# Patient Record
Sex: Male | Born: 1970 | Race: Black or African American | Hispanic: No | Marital: Single | Smoking: Never smoker
Health system: Southern US, Community
[De-identification: ages and names within clinical notes are randomized; demographics above are authoritative.]

## PROBLEM LIST (undated history)

## (undated) DIAGNOSIS — I10 Essential (primary) hypertension: Secondary | ICD-10-CM

---

## 2005-01-05 ENCOUNTER — Emergency Department: Payer: Self-pay | Admitting: Emergency Medicine

## 2005-02-18 ENCOUNTER — Emergency Department: Payer: Self-pay | Admitting: Emergency Medicine

## 2005-02-23 ENCOUNTER — Ambulatory Visit: Payer: Self-pay

## 2005-05-06 ENCOUNTER — Emergency Department: Payer: Self-pay | Admitting: Emergency Medicine

## 2006-12-12 ENCOUNTER — Emergency Department: Payer: Self-pay | Admitting: Emergency Medicine

## 2006-12-12 ENCOUNTER — Other Ambulatory Visit: Payer: Self-pay

## 2010-06-04 ENCOUNTER — Emergency Department: Payer: Self-pay | Admitting: Emergency Medicine

## 2013-09-06 ENCOUNTER — Emergency Department: Payer: Self-pay | Admitting: Emergency Medicine

## 2015-08-02 ENCOUNTER — Emergency Department
Admission: EM | Admit: 2015-08-02 | Discharge: 2015-08-02 | Disposition: A | Discharge status: Home / Self Care | Payer: Commercial Managed Care - HMO | Attending: Emergency Medicine | Admitting: Emergency Medicine

## 2015-08-02 ENCOUNTER — Encounter: Payer: Self-pay | Admitting: Emergency Medicine

## 2015-08-02 ENCOUNTER — Emergency Department: Payer: Commercial Managed Care - HMO

## 2015-08-02 ENCOUNTER — Other Ambulatory Visit: Payer: Self-pay

## 2015-08-02 DIAGNOSIS — R202 Paresthesia of skin: Secondary | ICD-10-CM | POA: Insufficient documentation

## 2015-08-02 DIAGNOSIS — R519 Headache, unspecified: Secondary | ICD-10-CM

## 2015-08-02 DIAGNOSIS — R55 Syncope and collapse: Secondary | ICD-10-CM | POA: Diagnosis not present

## 2015-08-02 DIAGNOSIS — I10 Essential (primary) hypertension: Secondary | ICD-10-CM | POA: Diagnosis not present

## 2015-08-02 DIAGNOSIS — R51 Headache: Secondary | ICD-10-CM | POA: Diagnosis not present

## 2015-08-02 DIAGNOSIS — R531 Weakness: Secondary | ICD-10-CM | POA: Diagnosis present

## 2015-08-02 DIAGNOSIS — Z79899 Other long term (current) drug therapy: Secondary | ICD-10-CM | POA: Insufficient documentation

## 2015-08-02 HISTORY — DX: Essential (primary) hypertension: I10

## 2015-08-02 LAB — CBC WITH DIFFERENTIAL/PLATELET
BASOS ABS: 0 10*3/uL (ref 0–0.1)
Basophils Relative: 1 %
EOS PCT: 3 %
Eosinophils Absolute: 0.1 10*3/uL (ref 0–0.7)
HCT: 42.1 % (ref 40.0–52.0)
HEMOGLOBIN: 14.5 g/dL (ref 13.0–18.0)
Lymphocytes Relative: 37 %
Lymphs Abs: 1.7 10*3/uL (ref 1.0–3.6)
MCH: 33.1 pg (ref 26.0–34.0)
MCHC: 34.4 g/dL (ref 32.0–36.0)
MCV: 96.1 fL (ref 80.0–100.0)
MONOS PCT: 11 %
Monocytes Absolute: 0.5 10*3/uL (ref 0.2–1.0)
NEUTROS ABS: 2.2 10*3/uL (ref 1.4–6.5)
NEUTROS PCT: 48 %
Platelets: 223 10*3/uL (ref 150–440)
RBC: 4.38 MIL/uL — AB (ref 4.40–5.90)
RDW: 14.2 % (ref 11.5–14.5)
WBC: 4.7 10*3/uL (ref 3.8–10.6)

## 2015-08-02 LAB — TROPONIN I: Troponin I: <0.03 ng/mL (ref <0.031)

## 2015-08-02 LAB — BASIC METABOLIC PANEL
ANION GAP: 8 (ref 5–15)
BUN: 12 mg/dL (ref 6–20)
CALCIUM: 9.4 mg/dL (ref 8.9–10.3)
CHLORIDE: 100 mmol/L — AB (ref 101–111)
CO2: 32 mmol/L (ref 22–32)
CREATININE: 0.95 mg/dL (ref 0.61–1.24)
Glucose, Bld: 99 mg/dL (ref 65–99)
POTASSIUM: 3.2 mmol/L — AB (ref 3.5–5.1)
SODIUM: 140 mmol/L (ref 135–145)

## 2015-08-02 MED ORDER — PROCHLORPERAZINE EDISYLATE 5 MG/ML IJ SOLN
10.0000 mg | Freq: Once | INTRAMUSCULAR | Status: AC
  Administered 2015-08-02: 10 mg via INTRAVENOUS
  Filled 2015-08-02: qty 2

## 2015-08-02 MED ORDER — SODIUM CHLORIDE 0.9 % IV BOLUS (SEPSIS)
1000.0000 mL | Freq: Once | INTRAVENOUS | Status: AC
  Administered 2015-08-02: 1000 mL via INTRAVENOUS

## 2015-08-02 NOTE — ED Notes (Signed)
Pt arrived via EMS. Pt reports he started to not feel well this morning and was driving and started to feel like his whole body went numb and lightheaded.  Was also diaphoretic. Reports headache started this morning before leaving, but got worse as he was driving. States he was having trouble grasping the steering wheel and pulled over into a parking lot. Patient is alert and oriented.  No slurred speech.  HA pain is 5/10 currently, but while driving HA pain was 16/10.

## 2015-08-02 NOTE — ED Provider Notes (Addendum)
[facility] Emergency Department Provider Note  ____________________________________________  Time seen: Seen upon arrival to the emergency department.  I have reviewed the triage vital signs and the nursing notes.   HISTORY  Chief Complaint Weakness    HPI Ricky Compton is a 44 y.o. male with a history of hypertension presents today with headache and near syncope. He said he woke this morning at about 7 AM with a mild frontal headache. He said that he was not hungry and so did not eat. He was then driving in his car and said that he felt his headache increase to a 10 out of 10 and then had numbness and tingling and weakness to his bilateral upper and lower extremities. He became acutely diaphoretic. This lasted for about 15 minutes. He has a mild frontal headache at this time but no longer has any numbness or weakness. Has a family history of high blood pressure but without any stroke or MI.   Past Medical History  Diagnosis Date  . Hypertension     There are no active problems to display for this patient.   History reviewed. No pertinent past surgical history.  Current Outpatient Rx  Name  Route  Sig  Dispense  Refill  . lisinopril-hydrochlorothiazide (PRINZIDE,ZESTORETIC) 10-12.5 MG per tablet   Oral   Take 1 tablet by mouth daily.           Allergies Tramadol and Tramadol-acetaminophen  History reviewed. No pertinent family history.  Social History History  Substance Use Topics  . Smoking status: Never Smoker   . Smokeless tobacco: Never Used  . Alcohol Use: No    Review of Systems Constitutional: No fever/chills Eyes: No visual changes. ENT: No sore throat. Cardiovascular: Denies chest pain. Respiratory: Denies shortness of breath. Gastrointestinal: No abdominal pain.  No nausea, no vomiting.  No diarrhea.  No constipation. Genitourinary: Negative for dysuria. Musculoskeletal: Negative for back pain. Skin: Negative for  rash. Neurological: Negative for headaches, focal weakness or numbness.  10-point ROS otherwise negative.  ____________________________________________   PHYSICAL EXAM:  VITAL SIGNS: ED Triage Vitals  Enc Vitals Group     BP 08/02/15 1109 141/98 mmHg     Pulse Rate 08/02/15 1109 63     Resp 08/02/15 1109 18     Temp 08/02/15 1109 98.3 F (36.8 C)     Temp Source 08/02/15 1109 Oral     SpO2 08/02/15 1109 97 %     Weight --      Height --      Head Cir --      Peak Flow --      Pain Score 08/02/15 1110 5     Pain Loc --      Pain Edu? --      Excl. in GC? --     Constitutional: Alert and oriented. Well appearing and in no acute distress. Eyes: Conjunctivae are normal. PERRL. EOMI. Head: Atraumatic. Nose: No congestion/rhinnorhea. Mouth/Throat: Mucous membranes are moist.  Oropharynx non-erythematous. Neck: No stridor.   Cardiovascular: Normal rate, regular rhythm. Grossly normal heart sounds.  Good peripheral circulation. Respiratory: Normal respiratory effort.  No retractions. Lungs CTAB. Gastrointestinal: Soft and nontender. No distention. No abdominal bruits. No CVA tenderness. Musculoskeletal: No lower extremity tenderness nor edema.  No joint effusions. Neurologic:  Normal speech and language. No gross focal neurologic deficits are appreciated. No gait instability. Skin:  Skin is warm, dry and intact. No rash noted. Psychiatric: Mood and affect are normal.  Speech and behavior are normal.  ____________________________________________   LABS (all labs ordered are listed, but only abnormal results are displayed)  Labs Reviewed  CBC WITH DIFFERENTIAL/PLATELET - Abnormal; Notable for the following:    RBC 4.38 (*)    All other components within normal limits  BASIC METABOLIC PANEL - Abnormal; Notable for the following:    Potassium 3.2 (*)    Chloride 100 (*)    All other components within normal limits  TROPONIN I  TROPONIN I    ____________________________________________  EKG  ED ECG REPORT I, Arelia Longest, the attending physician, personally viewed and interpreted this ECG.   Date: 08/02/2015  EKG Time: 1107  Rate: 58  Rhythm: Sinus bradycardia  Axis: Normal axis  Intervals:none  ST&T Change: No ST elevations or depressions. No abnormal T-wave inversions.  ____________________________________________  RADIOLOGY  Chest x-ray without any acute cardiopulmonary process. I personally reviewed these films.  CT of the brain without acute process. ____________________________________________   PROCEDURES   ____________________________________________   INITIAL IMPRESSION / ASSESSMENT AND PLAN / ED COURSE  Pertinent labs & imaging results that were available during my care of the patient were reviewed by me and considered in my medical decision making (see chart for details). ----------------------------------------- 4:00 PM on 08/02/2015 -----------------------------------------  Patient feeling back to baseline. No chest pain or headache at this time. Possible vagal response from nausea and not eating. No positive findings on workup today. Discussed the case with Dr. Welton Flakes of cardiology who will see the patient on Monday 1:30 in the office. Because of the patient's hypertension though the patient has some risk for cardiac cause to this and should follow with cardiology. Otherwise I counseled the patient to make sure to stay hydrated and to try not to skip meals. There could be some relation to his nausea and not eating this morning because this episode. ____________________________________________   FINAL CLINICAL IMPRESSION(S) / ED DIAGNOSES  Acute near-syncope. Acute headache. Initial visit.    Myrna Blazer, MD 08/02/15 1601  Furthermore, is unlikely to be subarachnoid or aneurysmal episode. Head CT done within 6 hours and negative. Headache Resolved by time patient  arrived to the emergency department.  Myrna Blazer, MD 08/02/15 [phone]

## 2015-08-02 NOTE — ED Notes (Signed)
Patient transported to CT and XR 

## 2017-02-14 DIAGNOSIS — I10 Essential (primary) hypertension: Secondary | ICD-10-CM | POA: Diagnosis not present

## 2017-02-14 DIAGNOSIS — E7800 Pure hypercholesterolemia, unspecified: Secondary | ICD-10-CM | POA: Diagnosis not present

## 2017-02-14 DIAGNOSIS — M109 Gout, unspecified: Secondary | ICD-10-CM | POA: Diagnosis not present

## 2017-02-17 DIAGNOSIS — E790 Hyperuricemia without signs of inflammatory arthritis and tophaceous disease: Secondary | ICD-10-CM | POA: Diagnosis not present

## 2017-02-17 DIAGNOSIS — E7800 Pure hypercholesterolemia, unspecified: Secondary | ICD-10-CM | POA: Diagnosis not present

## 2017-02-17 DIAGNOSIS — I10 Essential (primary) hypertension: Secondary | ICD-10-CM | POA: Diagnosis not present

## 2017-02-17 DIAGNOSIS — Z0001 Encounter for general adult medical examination with abnormal findings: Secondary | ICD-10-CM | POA: Diagnosis not present

## 2017-03-02 DIAGNOSIS — D649 Anemia, unspecified: Secondary | ICD-10-CM | POA: Diagnosis not present

## 2017-04-26 DIAGNOSIS — M542 Cervicalgia: Secondary | ICD-10-CM | POA: Diagnosis not present

## 2017-08-11 DIAGNOSIS — D649 Anemia, unspecified: Secondary | ICD-10-CM | POA: Diagnosis not present

## 2017-08-11 DIAGNOSIS — E790 Hyperuricemia without signs of inflammatory arthritis and tophaceous disease: Secondary | ICD-10-CM | POA: Diagnosis not present

## 2017-08-11 DIAGNOSIS — I10 Essential (primary) hypertension: Secondary | ICD-10-CM | POA: Diagnosis not present

## 2017-08-18 DIAGNOSIS — I10 Essential (primary) hypertension: Secondary | ICD-10-CM | POA: Diagnosis not present

## 2017-08-18 DIAGNOSIS — E790 Hyperuricemia without signs of inflammatory arthritis and tophaceous disease: Secondary | ICD-10-CM | POA: Diagnosis not present

## 2017-08-18 DIAGNOSIS — E7800 Pure hypercholesterolemia, unspecified: Secondary | ICD-10-CM | POA: Diagnosis not present

## 2017-10-13 DIAGNOSIS — Z23 Encounter for immunization: Secondary | ICD-10-CM | POA: Diagnosis not present

## 2017-10-26 DIAGNOSIS — R1310 Dysphagia, unspecified: Secondary | ICD-10-CM | POA: Diagnosis not present

## 2017-12-01 HISTORY — PX: ESOPHAGOGASTRODUODENOSCOPY: SHX1529

## 2018-02-13 DIAGNOSIS — E7800 Pure hypercholesterolemia, unspecified: Secondary | ICD-10-CM | POA: Diagnosis not present

## 2018-02-13 DIAGNOSIS — I10 Essential (primary) hypertension: Secondary | ICD-10-CM | POA: Diagnosis not present

## 2018-02-13 DIAGNOSIS — E790 Hyperuricemia without signs of inflammatory arthritis and tophaceous disease: Secondary | ICD-10-CM | POA: Diagnosis not present

## 2018-02-22 DIAGNOSIS — Z0001 Encounter for general adult medical examination with abnormal findings: Secondary | ICD-10-CM | POA: Diagnosis not present

## 2018-02-22 DIAGNOSIS — E7800 Pure hypercholesterolemia, unspecified: Secondary | ICD-10-CM | POA: Diagnosis not present

## 2018-02-22 DIAGNOSIS — I10 Essential (primary) hypertension: Secondary | ICD-10-CM | POA: Diagnosis not present

## 2018-08-16 DIAGNOSIS — I10 Essential (primary) hypertension: Secondary | ICD-10-CM | POA: Diagnosis not present

## 2018-08-24 DIAGNOSIS — E7800 Pure hypercholesterolemia, unspecified: Secondary | ICD-10-CM | POA: Diagnosis not present

## 2018-08-24 DIAGNOSIS — I10 Essential (primary) hypertension: Secondary | ICD-10-CM | POA: Diagnosis not present

## 2018-10-01 DIAGNOSIS — R300 Dysuria: Secondary | ICD-10-CM | POA: Diagnosis not present

## 2018-10-01 DIAGNOSIS — N342 Other urethritis: Secondary | ICD-10-CM | POA: Diagnosis not present

## 2018-12-12 ENCOUNTER — Emergency Department: Payer: 59

## 2018-12-12 ENCOUNTER — Encounter: Payer: Self-pay | Admitting: Emergency Medicine

## 2018-12-12 ENCOUNTER — Other Ambulatory Visit: Payer: Self-pay

## 2018-12-12 ENCOUNTER — Emergency Department
Admission: EM | Admit: 2018-12-12 | Discharge: 2018-12-12 | Disposition: A | Discharge status: Home / Self Care | Payer: 59 | Attending: Emergency Medicine | Admitting: Emergency Medicine

## 2018-12-12 DIAGNOSIS — Z79899 Other long term (current) drug therapy: Secondary | ICD-10-CM | POA: Insufficient documentation

## 2018-12-12 DIAGNOSIS — I10 Essential (primary) hypertension: Secondary | ICD-10-CM | POA: Insufficient documentation

## 2018-12-12 DIAGNOSIS — R0789 Other chest pain: Secondary | ICD-10-CM | POA: Diagnosis not present

## 2018-12-12 DIAGNOSIS — R079 Chest pain, unspecified: Secondary | ICD-10-CM | POA: Diagnosis not present

## 2018-12-12 LAB — CBC
HEMATOCRIT: 43.5 % (ref 39.0–52.0)
HEMOGLOBIN: 14.4 g/dL (ref 13.0–17.0)
MCH: 31.7 pg (ref 26.0–34.0)
MCHC: 33.1 g/dL (ref 30.0–36.0)
MCV: 95.8 fL (ref 80.0–100.0)
NRBC: 0 % (ref 0.0–0.2)
Platelets: 239 10*3/uL (ref 150–400)
RBC: 4.54 MIL/uL (ref 4.22–5.81)
RDW: 13.2 % (ref 11.5–15.5)
WBC: 5.5 10*3/uL (ref 4.0–10.5)

## 2018-12-12 LAB — BASIC METABOLIC PANEL
Anion gap: 11 (ref 5–15)
BUN: 15 mg/dL (ref 6–20)
CO2: 29 mmol/L (ref 22–32)
Calcium: 9.9 mg/dL (ref 8.9–10.3)
Chloride: 99 mmol/L (ref 98–111)
Creatinine, Ser: 0.91 mg/dL (ref 0.61–1.24)
GFR calc Af Amer: >60 mL/min (ref >60)
Glucose, Bld: 101 mg/dL — ABNORMAL HIGH (ref 70–99)
POTASSIUM: 3 mmol/L — AB (ref 3.5–5.1)
SODIUM: 139 mmol/L (ref 135–145)

## 2018-12-12 LAB — TROPONIN I

## 2018-12-12 NOTE — ED Notes (Signed)
MD at bedside. 

## 2018-12-12 NOTE — ED Triage Notes (Signed)
Says startedc haing middle chest pain about 930am  Says "light pain"

## 2018-12-12 NOTE — ED Notes (Signed)
Pt states he was at work and had about 5 minutes of central CP. Denies any other symptoms. Hx HTN. Hasn't missed any doses. A&O, ambulatory. No distress noted. Pt denies pain at this time, only c/o is he is tired.

## 2018-12-12 NOTE — ED Provider Notes (Signed)
[facility] Emergency Department Provider Note   ____________________________________________    I have reviewed the triage vital signs and the nursing notes.   HISTORY  Chief Complaint Chest Pain     HPI Ricky Compton is a 47 y.o. male who presents with complaints of chest pain.  Patient describes burning pressure-like pain in his chest at approximately 930 this morning while at work.  His coworkers told him he looked "sweaty ".  He described mild nausea as well.  Symptoms were resolved upon arrival to the ED.  He does not smoke.  He reports his blood pressure is good and that he eats "right".  Does have a history of GERD.  No history of heart disease in his family.  He reports he worked third shift last night.  No radiation of pain.  Has not take anything for this   Past Medical History:  Diagnosis Date  . Hypertension     There are no active problems to display for this patient.   History reviewed. No pertinent surgical history.  Prior to Admission medications   Medication Sig Start Date End Date Taking? Authorizing Provider  lisinopril-hydrochlorothiazide (PRINZIDE,ZESTORETIC) 10-12.5 MG per tablet Take 1 tablet by mouth daily. 02/05/15   [provider]     Allergies Tramadol and Tramadol-acetaminophen  No family history on file.  Social History Social History   Tobacco Use  . Smoking status: Never Smoker  . Smokeless tobacco: Never Used  Substance Use Topics  . Alcohol use: No  . Drug use: Not on file    Review of Systems  Constitutional: No fever/chills Eyes: No visual changes.  ENT: No sore throat. Cardiovascular: Denies chest pain. Respiratory: Denies shortness of breath. Gastrointestinal: No abdominal pain.  No nausea, no vomiting.   Genitourinary: Negative for dysuria. Musculoskeletal: Negative for back pain. Skin: Negative for rash. Neurological: Negative for headaches or  weakness   ____________________________________________   PHYSICAL EXAM:  VITAL SIGNS: ED Triage Vitals  Enc Vitals Group     BP 12/12/18 1122 116/78     Pulse Rate 12/12/18 1122 69     Resp 12/12/18 1122 17     Temp 12/12/18 1122 98.2 F (36.8 C)     Temp Source 12/12/18 1122 Oral     SpO2 12/12/18 1122 98 %     Weight 12/12/18 1118 104.3 kg (230 lb)     Height 12/12/18 1118 1.753 m (5\' 9" )     Head Circumference --      Peak Flow --      Pain Score 12/12/18 1117 0     Pain Loc --      Pain Edu? --      Excl. in GC? --     Constitutional: Alert and oriented. No acute distress. Pleasant and interactive Eyes: Conjunctivae are normal.   Nose: No congestion/rhinnorhea. Mouth/Throat: Mucous membranes are moist.    Cardiovascular: Normal rate, regular rhythm. Grossly normal heart sounds.  Good peripheral circulation. Respiratory: Normal respiratory effort.  No retractions. Lungs CTAB. Gastrointestinal: Soft and nontender. No distention.  No CVA tenderness.  Musculoskeletal: No lower extremity tenderness nor edema.  Warm and well perfused Neurologic:  Normal speech and language. No gross focal neurologic deficits are appreciated.  Skin:  Skin is warm, dry and intact. No rash noted. Psychiatric: Mood and affect are normal. Speech and behavior are normal.  ____________________________________________   LABS (all labs ordered are listed, but only abnormal results are displayed)  Labs Reviewed  BASIC METABOLIC PANEL - Abnormal; Notable for the following components:      Result Value   Potassium 3.0 (*)    Glucose, Bld 101 (*)    All other components within normal limits  CBC  TROPONIN I  TROPONIN I   ____________________________________________  EKG  ED ECG REPORT I, Jene Every, the attending physician, personally viewed and interpreted this ECG.  Date: 12/12/2018  Rhythm: normal sinus rhythm QRS Axis: normal Intervals: normal ST/T Wave abnormalities:  normal Narrative Interpretation: no evidence of acute ischemia  ____________________________________________  RADIOLOGY  Chest x-ray unremarkable ____________________________________________   PROCEDURES  Procedure(s) performed: No  Procedures   Critical Care performed: No ____________________________________________   INITIAL IMPRESSION / ASSESSMENT AND PLAN / ED COURSE  Pertinent labs & imaging results that were available during my care of the patient were reviewed by me and considered in my medical decision making (see chart for details).  Patient presents with brief episode of chest pain possibly with some diaphoresis, asymptomatic currently.  No significant risk factors besides age.  Initial troponin normal.  EKG reassuring.  Second troponin normal as well.  Discussed with the patient the need for outpatient follow-up with cardiology, return precautions discussed    ____________________________________________   FINAL CLINICAL IMPRESSION(S) / ED DIAGNOSES  Final diagnoses:  Atypical chest pain        Note:  This document was prepared using Dragon voice recognition software and may include unintentional dictation errors.    Jene Every, MD 12/12/18 [phone]

## 2018-12-12 NOTE — ED Notes (Addendum)
First Nurse Note:  Patient states he feels "tired".  [facility].  Was being seen there for chest pain as a workmen's comp.  Patient works 3rd shift.  Alert and oriented, denies chest pain at this time.  Tech called for EKG.

## 2018-12-21 DIAGNOSIS — R079 Chest pain, unspecified: Secondary | ICD-10-CM | POA: Diagnosis not present

## 2018-12-25 DIAGNOSIS — R079 Chest pain, unspecified: Secondary | ICD-10-CM | POA: Diagnosis not present

## 2019-01-02 DIAGNOSIS — I10 Essential (primary) hypertension: Secondary | ICD-10-CM | POA: Diagnosis not present

## 2019-01-02 DIAGNOSIS — R079 Chest pain, unspecified: Secondary | ICD-10-CM | POA: Diagnosis not present

## 2019-02-16 DIAGNOSIS — I10 Essential (primary) hypertension: Secondary | ICD-10-CM | POA: Diagnosis not present

## 2019-02-16 DIAGNOSIS — E7800 Pure hypercholesterolemia, unspecified: Secondary | ICD-10-CM | POA: Diagnosis not present

## 2019-02-23 DIAGNOSIS — Z0001 Encounter for general adult medical examination with abnormal findings: Secondary | ICD-10-CM | POA: Diagnosis not present

## 2019-02-23 DIAGNOSIS — I10 Essential (primary) hypertension: Secondary | ICD-10-CM | POA: Diagnosis not present

## 2019-02-23 DIAGNOSIS — E7800 Pure hypercholesterolemia, unspecified: Secondary | ICD-10-CM | POA: Diagnosis not present

## 2019-05-04 ENCOUNTER — Emergency Department
Admission: EM | Admit: 2019-05-04 | Discharge: 2019-05-04 | Disposition: A | Discharge status: Home / Self Care | Payer: 59 | Attending: Emergency Medicine | Admitting: Emergency Medicine

## 2019-05-04 ENCOUNTER — Other Ambulatory Visit: Payer: Self-pay

## 2019-05-04 ENCOUNTER — Encounter: Payer: Self-pay | Admitting: Emergency Medicine

## 2019-05-04 DIAGNOSIS — Z79899 Other long term (current) drug therapy: Secondary | ICD-10-CM | POA: Insufficient documentation

## 2019-05-04 DIAGNOSIS — I10 Essential (primary) hypertension: Secondary | ICD-10-CM | POA: Insufficient documentation

## 2019-05-04 DIAGNOSIS — F410 Panic disorder [episodic paroxysmal anxiety] without agoraphobia: Secondary | ICD-10-CM | POA: Diagnosis not present

## 2019-05-04 DIAGNOSIS — I480 Paroxysmal atrial fibrillation: Secondary | ICD-10-CM | POA: Diagnosis not present

## 2019-05-04 DIAGNOSIS — F4321 Adjustment disorder with depressed mood: Secondary | ICD-10-CM | POA: Insufficient documentation

## 2019-05-04 DIAGNOSIS — F430 Acute stress reaction: Secondary | ICD-10-CM | POA: Insufficient documentation

## 2019-05-04 DIAGNOSIS — R51 Headache: Secondary | ICD-10-CM | POA: Diagnosis present

## 2019-05-04 LAB — CBC WITH DIFFERENTIAL/PLATELET
Abs Immature Granulocytes: 0.01 10*3/uL (ref 0.00–0.07)
Basophils Absolute: 0.1 10*3/uL (ref 0.0–0.1)
Basophils Relative: 1 %
Eosinophils Absolute: 0.1 10*3/uL (ref 0.0–0.5)
Eosinophils Relative: 2 %
HCT: 45.2 % (ref 39.0–52.0)
Hemoglobin: 15.1 g/dL (ref 13.0–17.0)
Immature Granulocytes: 0 %
Lymphocytes Relative: 21 %
Lymphs Abs: 1.7 10*3/uL (ref 0.7–4.0)
MCH: 32.2 pg (ref 26.0–34.0)
MCHC: 33.4 g/dL (ref 30.0–36.0)
MCV: 96.4 fL (ref 80.0–100.0)
Monocytes Absolute: 0.5 10*3/uL (ref 0.1–1.0)
Monocytes Relative: 6 %
Neutro Abs: 5.7 10*3/uL (ref 1.7–7.7)
Neutrophils Relative %: 70 %
Platelets: 253 10*3/uL (ref 150–400)
RBC: 4.69 MIL/uL (ref 4.22–5.81)
RDW: 13.3 % (ref 11.5–15.5)
WBC: 8.1 10*3/uL (ref 4.0–10.5)
nRBC: 0 % (ref 0.0–0.2)

## 2019-05-04 LAB — BASIC METABOLIC PANEL
Anion gap: 12 (ref 5–15)
BUN: 14 mg/dL (ref 6–20)
CO2: 26 mmol/L (ref 22–32)
Calcium: 9.1 mg/dL (ref 8.9–10.3)
Chloride: 101 mmol/L (ref 98–111)
Creatinine, Ser: 1.07 mg/dL (ref 0.61–1.24)
GFR calc Af Amer: >60 mL/min (ref >60)
GFR calc non Af Amer: >60 mL/min (ref >60)
Glucose, Bld: 131 mg/dL — ABNORMAL HIGH (ref 70–99)
Potassium: 2.9 mmol/L — ABNORMAL LOW (ref 3.5–5.1)
Sodium: 139 mmol/L (ref 135–145)

## 2019-05-04 LAB — TROPONIN I: Troponin I: <0.03 ng/mL (ref <0.03)

## 2019-05-04 LAB — TSH: TSH: 0.999 u[IU]/mL (ref 0.350–4.500)

## 2019-05-04 LAB — T4, FREE: Free T4: 0.78 ng/dL — ABNORMAL LOW (ref 0.82–1.77)

## 2019-05-04 LAB — MAGNESIUM: Magnesium: 1.8 mg/dL (ref 1.7–2.4)

## 2019-05-04 MED ORDER — SODIUM CHLORIDE 0.9 % IV BOLUS
1000.0000 mL | Freq: Once | INTRAVENOUS | Status: AC
  Administered 2019-05-04: 1000 mL via INTRAVENOUS

## 2019-05-04 MED ORDER — POTASSIUM CHLORIDE CRYS ER 20 MEQ PO TBCR
60.0000 meq | EXTENDED_RELEASE_TABLET | Freq: Once | ORAL | Status: AC
  Administered 2019-05-04: 60 meq via ORAL
  Filled 2019-05-04: qty 3

## 2019-05-04 MED ORDER — LORAZEPAM 2 MG/ML IJ SOLN
2.0000 mg | Freq: Once | INTRAMUSCULAR | Status: AC
  Administered 2019-05-04: 2 mg via INTRAMUSCULAR
  Filled 2019-05-04: qty 1

## 2019-05-04 MED ORDER — ASPIRIN 81 MG PO CHEW
162.0000 mg | CHEWABLE_TABLET | Freq: Once | ORAL | Status: AC
  Administered 2019-05-04: 162 mg via ORAL
  Filled 2019-05-04: qty 2

## 2019-05-04 NOTE — ED Provider Notes (Signed)
[facility] Emergency Department Provider Note  ____________________________________________  Time seen: Approximately 11:23 AM  I have reviewed the triage vital signs and the nursing notes.   HISTORY  Chief Complaint Headache and Numbness    HPI Ricky GibbonsJoseph L Compton is a 48 y.o. male with a history of hypertension who woke up this morning feeling like his heart was racing, feeling very anxious, and then feeling like his whole body was numb.  Symptoms are sudden onset, constant, no aggravating or alleviating factors.  Denies chest pain or shortness of breath.  No recent illness.  He notes that his mother recently died after a 4-year battle with cancer.  Today is the funeral.  He has seen cardiology Dr. Darrold JunkerParaschos before, had a stress test about 6 months ago which was okay according to the patient  Past Medical History:  Diagnosis Date  . Hypertension      There are no active problems to display for this patient.    History reviewed. No pertinent surgical history.   Prior to Admission medications   Medication Sig Start Date End Date Taking? Authorizing Provider  lisinopril-hydrochlorothiazide (PRINZIDE,ZESTORETIC) 10-12.5 MG per tablet Take 1 tablet by mouth daily. 02/05/15  Yes [provider]     Allergies Tramadol and Tramadol-acetaminophen   History reviewed. No pertinent family history.  Social History Social History   Tobacco Use  . Smoking status: Never Smoker  . Smokeless tobacco: Never Used  Substance Use Topics  . Alcohol use: No  . Drug use: Not on file    Review of Systems  Constitutional:   No fever or chills.  ENT:   No sore throat. No rhinorrhea. Cardiovascular:   No chest pain or syncope. Respiratory:   No dyspnea or cough. Gastrointestinal:   Negative for abdominal pain, vomiting and diarrhea.  Musculoskeletal:   Negative for focal pain or swelling All other systems reviewed and are negative except as  documented above in ROS and HPI.  ____________________________________________   PHYSICAL EXAM:  VITAL SIGNS: ED Triage Vitals [05/04/19 1003]  Enc Vitals Group     BP      Pulse      Resp      Temp      Temp src      SpO2      Weight 230 lb (104.3 kg)     Height 5\' 9"  (1.753 m)     Head Circumference      Peak Flow      Pain Score 0     Pain Loc      Pain Edu?      Excl. in GC?     Vital signs reviewed, nursing assessments reviewed.   Constitutional:   Alert and oriented. Non-toxic appearance. Eyes:   Conjunctivae are normal. EOMI. PERRL. ENT      Head:   Normocephalic and atraumatic.      Nose:   No congestion/rhinnorhea.       Mouth/Throat:   MMM, no pharyngeal erythema. No peritonsillar mass.       Neck:   No meningismus. Full ROM. Hematological/Lymphatic/Immunilogical:   No cervical lymphadenopathy. Cardiovascular:   Sinus tachycardia heart rate 100. Symmetric bilateral radial and DP pulses.  No murmurs. Cap refill less than 2 seconds. Respiratory:   Normal respiratory effort without tachypnea/retractions. Breath sounds are clear and equal bilaterally. No wheezes/rales/rhonchi. Gastrointestinal:   Soft and nontender. Non distended. There is no CVA tenderness.  No rebound, rigidity, or guarding.  Musculoskeletal:  Normal range of motion in all extremities. No joint effusions.  No lower extremity tenderness.  No edema. Neurologic:   Normal speech and language.  Motor grossly intact. No acute focal neurologic deficits are appreciated.  Skin:    Skin is warm, dry and intact. No rash noted.  No petechiae, purpura, or bullae.  ____________________________________________    LABS (pertinent positives/negatives) (all labs ordered are listed, but only abnormal results are displayed) Labs Reviewed  CBC WITH DIFFERENTIAL/PLATELET  BASIC METABOLIC PANEL  TROPONIN I  TSH  T4, FREE  MAGNESIUM   ____________________________________________   EKG  Initial EKG  interpreted by me Atrial fibrillation rate of 121, normal axis intervals QRS ST segments and T waves.  A. fib appears to be new compared to prior EKG.  Repeat EKG interpreted by me, obtained at 11:14 AM  Date: 05/04/2019  Rate: 98  Rhythm: normal sinus rhythm  QRS Axis: normal  Intervals: normal  ST/T Wave abnormalities: normal  Conduction Disutrbances: none  Narrative Interpretation: unremarkable      ____________________________________________    RADIOLOGY  No results found.  ____________________________________________   PROCEDURES Procedures  ____________________________________________  DIFFERENTIAL DIAGNOSIS   Electrolyte abnormality, hypothyroidism, stress induced atrial fibrillation, panic attack  CLINICAL IMPRESSION / ASSESSMENT AND PLAN / ED COURSE  Medications ordered in the ED: Medications  aspirin chewable tablet 162 mg (has no administration in time range)  LORazepam (ATIVAN) injection 2 mg (2 mg Intramuscular Given 05/04/19 1011)  sodium chloride 0.9 % bolus 1,000 mL (1,000 mLs Intravenous New Bag/Given 05/04/19 1056)    Pertinent labs & imaging results that were available during my care of the patient were reviewed by me and considered in my medical decision making (see chart for details).  Ricky Compton was evaluated in Emergency Department on 05/04/2019 for the symptoms described in the history of present illness. He was evaluated in the context of the global COVID-19 pandemic, which necessitated consideration that the patient might be at risk for infection with the SARS-CoV-2 virus that causes COVID-19. Institutional protocols and algorithms that pertain to the evaluation of patients at risk for COVID-19 are in a state of rapid change based on information released by regulatory bodies including the CDC and federal and state organizations. These policies and algorithms were followed during the patient's care in the ED.   Patient presents with clear  symptoms of a panic attack.  Doubt he is having a stroke.  Will check electrolytes, TSH, blood count.Considering the patient's symptoms, medical history, and physical examination today, I have low suspicion for ACS, PE, TAD, pneumothorax, carditis, mediastinitis, pneumonia, CHF, or sepsis.  Initial EKG reveals atrial fibrillation which rapidly resolved after giving the patient Ativan for calling and a small amount of IV fluids and reverted back to a normal sinus rhythm.  This was likely induced by a surge of stress hormones, but he will need to follow-up with cardiology for evaluation of possible PAF.  Clinical Course as of May 03 1122  Fri May 04, 2019  1119 Pt feeling better now, now with NSR rate 90-100, normal ECG. All c/w panic attack. Will plan for DC home if labs reassuring, f/u with Dr. Darrold Junker who he has seen before for possible holter.   [PS]    Clinical Course User Index [PS] Sharman Cheek, MD     ----------------------------------------- 12:18 PM on 05/04/2019 -----------------------------------------  Labs unremarkable.  Potassium is 2.9 which is likely related to his hyperventilation.  We will give him some potassium replacement  and then he is stable for discharge.  With his atypical symptoms, he does not require further cardiac work-up at this time and can follow-up with cardiology outpatient.  ____________________________________________   FINAL CLINICAL IMPRESSION(S) / ED DIAGNOSES    Final diagnoses:  Panic attack as reaction to stress  Grief  Paroxysmal atrial fibrillation [facility])     ED Discharge Orders    None      Portions of this note were generated with dragon dictation software. Dictation errors may occur despite best attempts at proofreading.   Sharman Cheek, MD 05/04/19 1218

## 2019-05-04 NOTE — Discharge Instructions (Addendum)
Take an 81mg  aspirin once a day until you follow up with cardiology.

## 2019-05-04 NOTE — ED Triage Notes (Signed)
Pt brought by sister who thought he was having a stroke. When patient woke this morning was having numbness to both hands and feet.  On way to room patient states "my whole body is numb". Also c/o headache. Per sister they are burying their mother today.

## 2019-07-12 ENCOUNTER — Other Ambulatory Visit: Admission: RE | Admit: 2019-07-12 | Payer: 59 | Source: Ambulatory Visit

## 2019-07-18 ENCOUNTER — Other Ambulatory Visit
Admission: RE | Admit: 2019-07-18 | Discharge: 2019-07-18 | Disposition: A | Discharge status: Home / Self Care | Payer: 59 | Source: Ambulatory Visit | Attending: Internal Medicine | Admitting: Internal Medicine

## 2019-07-18 ENCOUNTER — Other Ambulatory Visit: Payer: Self-pay

## 2019-07-18 DIAGNOSIS — Z1159 Encounter for screening for other viral diseases: Secondary | ICD-10-CM | POA: Diagnosis not present

## 2019-07-18 LAB — SARS CORONAVIRUS 2 (TAT 6-24 HRS): SARS Coronavirus 2: NEGATIVE

## 2019-07-19 ENCOUNTER — Other Ambulatory Visit: Payer: 59

## 2019-07-20 ENCOUNTER — Encounter: Payer: Self-pay | Admitting: *Deleted

## 2019-07-23 ENCOUNTER — Encounter: Payer: Self-pay | Admitting: Emergency Medicine

## 2019-07-23 ENCOUNTER — Ambulatory Visit: Payer: 59 | Admitting: Certified Registered Nurse Anesthetist

## 2019-07-23 ENCOUNTER — Other Ambulatory Visit: Payer: Self-pay

## 2019-07-23 ENCOUNTER — Ambulatory Visit
Admission: RE | Admit: 2019-07-23 | Discharge: 2019-07-23 | Disposition: A | Discharge status: Home / Self Care | Payer: 59 | Attending: Internal Medicine | Admitting: Internal Medicine

## 2019-07-23 ENCOUNTER — Encounter
Admission: RE | Disposition: A | Discharge status: Home / Self Care | Payer: Self-pay | Source: Home / Self Care | Attending: Internal Medicine

## 2019-07-23 DIAGNOSIS — R1310 Dysphagia, unspecified: Secondary | ICD-10-CM | POA: Insufficient documentation

## 2019-07-23 DIAGNOSIS — Z79899 Other long term (current) drug therapy: Secondary | ICD-10-CM | POA: Insufficient documentation

## 2019-07-23 DIAGNOSIS — I10 Essential (primary) hypertension: Secondary | ICD-10-CM | POA: Insufficient documentation

## 2019-07-23 DIAGNOSIS — Z1211 Encounter for screening for malignant neoplasm of colon: Secondary | ICD-10-CM | POA: Insufficient documentation

## 2019-07-23 DIAGNOSIS — K228 Other specified diseases of esophagus: Secondary | ICD-10-CM | POA: Insufficient documentation

## 2019-07-23 DIAGNOSIS — K219 Gastro-esophageal reflux disease without esophagitis: Secondary | ICD-10-CM | POA: Insufficient documentation

## 2019-07-23 HISTORY — PX: ESOPHAGOGASTRODUODENOSCOPY (EGD) WITH PROPOFOL: SHX5813

## 2019-07-23 HISTORY — PX: COLONOSCOPY WITH PROPOFOL: SHX5780

## 2019-07-23 SURGERY — ESOPHAGOGASTRODUODENOSCOPY (EGD) WITH PROPOFOL
Anesthesia: General

## 2019-07-23 MED ORDER — PROPOFOL 500 MG/50ML IV EMUL
INTRAVENOUS | Status: AC
  Filled 2019-07-23: qty 50

## 2019-07-23 MED ORDER — PROPOFOL 500 MG/50ML IV EMUL
INTRAVENOUS | Status: DC | PRN
  Administered 2019-07-23: 150 ug/kg/min via INTRAVENOUS

## 2019-07-23 MED ORDER — SODIUM CHLORIDE 0.9 % IV SOLN
INTRAVENOUS | Status: DC
  Administered 2019-07-23: 1000 mL via INTRAVENOUS

## 2019-07-23 MED ORDER — LIDOCAINE HCL (CARDIAC) PF 100 MG/5ML IV SOSY
PREFILLED_SYRINGE | INTRAVENOUS | Status: DC | PRN
  Administered 2019-07-23: 100 mg via INTRATRACHEAL

## 2019-07-23 MED ORDER — MIDAZOLAM HCL 2 MG/2ML IJ SOLN
INTRAMUSCULAR | Status: AC
  Filled 2019-07-23: qty 2

## 2019-07-23 MED ORDER — PROPOFOL 10 MG/ML IV BOLUS
INTRAVENOUS | Status: DC | PRN
  Administered 2019-07-23: 20 mg via INTRAVENOUS
  Administered 2019-07-23: 50 mg via INTRAVENOUS
  Administered 2019-07-23: 30 mg via INTRAVENOUS

## 2019-07-23 MED ORDER — LIDOCAINE HCL (PF) 2 % IJ SOLN
INTRAMUSCULAR | Status: AC
  Filled 2019-07-23: qty 10

## 2019-07-23 NOTE — Op Note (Signed)
[facility] Patient Name: Ricky Compton Procedure Date: 07/23/2019 2:01 PM MRN: [id number] Account #: [id number] Date of Birth: [birth year] Admit Type: Outpatient Age: 48 Room: [facility] ENDO ROOM 4 Gender: Male Note Status: Finalized Procedure:            Colonoscopy Indications:          Screening for colorectal malignant neoplasm Providers:            Benay Pike. Alice Reichert MD, MD Referring MD:         Baxter Hire, MD (Referring MD) Medicines:            Propofol per Anesthesia Complications:        No immediate complications. Procedure:            Pre-Anesthesia Assessment:                       - The risks and benefits of the procedure and the                        sedation options and risks were discussed with the                        patient. All questions were answered and informed                        consent was obtained.                       - Patient identification and proposed procedure were                        verified prior to the procedure by the nurse. The                        procedure was verified in the procedure room.                       - ASA Grade Assessment: III - A patient with severe                        systemic disease.                       - After reviewing the risks and benefits, the patient                        was deemed in satisfactory condition to undergo the                        procedure.                       After obtaining informed consent, the colonoscope was                        passed under direct vision. Throughout the procedure,                        the patient's blood pressure, pulse, and oxygen  saturations were monitored continuously. The                        Colonoscope was introduced through the anus and                        advanced to the the cecum, identified by appendiceal                        orifice and ileocecal valve. The colonoscopy was                   technically difficult and complex due to a redundant                        colon. Successful completion of the procedure was aided                        by changing the patient to a prone position. Findings:      The perianal and digital rectal examinations were normal. Pertinent       negatives include normal sphincter tone and no palpable rectal lesions.      The colon (entire examined portion) appeared normal.      The exam was otherwise without abnormality on direct and retroflexion       views. Impression:           - The entire examined colon is normal.                       - The examination was otherwise normal on direct and                        retroflexion views.                       - No specimens collected. Recommendation:       - Patient has a contact number available for                        emergencies. The signs and symptoms of potential                        delayed complications were discussed with the patient.                        Return to normal activities tomorrow. Written discharge                        instructions were provided to the patient.                       - Resume previous diet.                       - Continue present medications.                       - Repeat colonoscopy in 10 years for screening purposes.                       - Return to physician assistant in 3 months.                       -  Please follow up with Harmon DunMichelle Johnson, PA-C for your                        [facility] to [facility]. Of course, I                        remain available to you if you need any help. Procedure Code(s):    --- Professional ---                       [id number], Colorectal cancer screening; colonoscopy on                        individual not meeting criteria for high risk Diagnosis Code(s):    --- Professional ---                       Z12.11, Encounter for screening for malignant neoplasm                        of  colon CPT copyright 2019 American Medical Association. All rights reserved. The codes documented in this report are preliminary and upon coder review may  be revised to meet current compliance requirements. Stanton Kidneyeodoro K Jameka Ivie MD, MD 07/23/2019 2:50:54 PM This report has been signed electronically. Number of Addenda: 0 Note Initiated On: 07/23/2019 2:01 PM Scope Withdrawal Time: 0 hours 7 minutes 55 seconds  Total Procedure Duration: 0 hours 18 minutes 12 seconds  Estimated Blood Loss: Estimated blood loss: none.      [facility]

## 2019-07-23 NOTE — Op Note (Signed)
[facility] Patient Name: Ricky LoveJoseph Drone Procedure Date: 07/23/2019 2:01 PM MRN: [id number] Account #: [id number] Date of Birth: 09-07-1971 Admit Type: Outpatient Age: [age] Room: [facility] ENDO ROOM 4 Gender: Male Note Status: Finalized Procedure:            Upper GI endoscopy Indications:          Dysphagia Providers:            Boykin Nearingeodoro K. Norma Fredricksonoledo MD, MD Referring MD:         Gracelyn NurseJohn D. Johnston, MD (Referring MD) Medicines:            Propofol per Anesthesia Complications:        No immediate complications. Procedure:            Pre-Anesthesia Assessment:                       - The risks and benefits of the procedure and the                        sedation options and risks were discussed with the                        patient. All questions were answered and informed                        consent was obtained.                       - Patient identification and proposed procedure were                        verified prior to the procedure by the nurse. The                        procedure was verified in the procedure room.                       - ASA Grade Assessment: III - A patient with severe                        systemic disease.                       - After reviewing the risks and benefits, the patient                        was deemed in satisfactory condition to undergo the                        procedure.                       After obtaining informed consent, the endoscope was                        passed under direct vision. Throughout the procedure,                        the patient's blood pressure, pulse, and oxygen  saturations were monitored continuously. The Endoscope                        was introduced through the mouth, and advanced to the                        third part of duodenum. The upper GI endoscopy was                        technically difficult and complex due to abnormal     anatomy. Successful completion of the procedure was                        aided by lavage. The patient tolerated the procedure                        well. Findings:      No appreciable esophageal motility was noted. In addition, a hypertonic       lower esophageal sphincter was found. There was severe resistance to       endoscope advancement into the stomach. The Z-line was regular. The       gastroesophageal junction was normal on retroflexed view. The dilation       site was examined following endoscope reinsertion and showed moderate       mucosal disruption and moderate improvement in luminal narrowing.      The entire examined stomach was normal.      The examined duodenum was normal.      The lumen of the esophagus was severely dilated.      Fluid was found in the entire esophagus. Impression:           - The examination was suspicious for achalasia.                       - Normal stomach.                       - Normal examined duodenum.                       - Dilation in the entire esophagus.                       - Fluid in the esophagus.                       - No specimens collected. Recommendation:       - Monitor results to esophageal dilation                       - Perform ambulatory esophageal manometry at                        appointment to be scheduled.                       - Proceed with colonoscopy Procedure Code(s):    --- Professional ---                       [phone], Esophagogastroduodenoscopy, flexible, transoral;  diagnostic, including collection of specimen(s) by                        brushing or washing, when performed (separate procedure) Diagnosis Code(s):    --- Professional ---                       R13.10, Dysphagia, unspecified                       K22.8, Other specified diseases of esophagus CPT copyright 2019 American Medical Association. All rights reserved. The codes documented in this report are preliminary and upon  coder review may  be revised to meet current compliance requirements. Stanton Kidneyeodoro K Donatella Walski MD, MD 07/23/2019 2:26:32 PM This report has been signed electronically. Number of Addenda: 0 Note Initiated On: 07/23/2019 2:01 PM Estimated Blood Loss: Estimated blood loss was minimal.      [facility]

## 2019-07-23 NOTE — Anesthesia Preprocedure Evaluation (Signed)
Anesthesia Evaluation  Patient identified by MRN, date of birth, ID band Patient awake    Reviewed: Allergy & Precautions, NPO status , Patient's Chart, lab work & pertinent test results  History of Anesthesia Complications Negative for: history of anesthetic complications  Airway Mallampati: II       Dental   Pulmonary neg sleep apnea, neg COPD,           Cardiovascular hypertension, Pt. on medications (-) Past MI and (-) CHF (-) dysrhythmias (-) Valvular Problems/Murmurs     Neuro/Psych neg Seizures    GI/Hepatic Neg liver ROS, GERD  Medicated and Controlled,  Endo/Other  neg diabetes  Renal/GU negative Renal ROS     Musculoskeletal   Abdominal   Peds  Hematology   Anesthesia Other Findings   Reproductive/Obstetrics                             Anesthesia Physical Anesthesia Plan  ASA: II  Anesthesia Plan: General   Post-op Pain Management:    Induction: Intravenous  PONV Risk Score and Plan: 2  Airway Management Planned: Nasal Cannula  Additional Equipment:   Intra-op Plan:   Post-operative Plan:   Informed Consent: I have reviewed the patients History and Physical, chart, labs and discussed the procedure including the risks, benefits and alternatives for the proposed anesthesia with the patient or authorized representative who has indicated his/her understanding and acceptance.       Plan Discussed with:   Anesthesia Plan Comments:         Anesthesia Quick Evaluation

## 2019-07-23 NOTE — Anesthesia Post-op Follow-up Note (Signed)
Anesthesia QCDR form completed.        

## 2019-07-23 NOTE — Anesthesia Postprocedure Evaluation (Signed)
Anesthesia Post Note  Patient: Ricky Compton  Procedure(s) Performed: ESOPHAGOGASTRODUODENOSCOPY (EGD) WITH PROPOFOL (N/A ) COLONOSCOPY WITH PROPOFOL (N/A )  Patient location during evaluation: PACU Anesthesia Type: General Level of consciousness: awake and alert Pain management: pain level controlled Vital Signs Assessment: post-procedure vital signs reviewed and stable Respiratory status: spontaneous breathing and respiratory function stable Cardiovascular status: stable Anesthetic complications: no     Last Vitals:  Vitals:   07/23/19 1514 07/23/19 1521  BP: 118/83 117/80  Pulse: 75 76  Resp: 20 (!) 21  Temp:    SpO2: 100% 100%    Last Pain:  Vitals:   07/23/19 1521  TempSrc:   PainSc: 0-No pain                 Avanish Cerullo K

## 2019-07-23 NOTE — H&P (Signed)
Outpatient short stay form Pre-procedure 07/23/2019 1:58 PM Teodoro K. Alice Reichert, M.D.  Primary Physician: Harrel Lemon, M.D.  Reason for visit:  Dysphagia, Colon cancer screening  History of present illness:  Patient c/o dysphagia to solids. Previous EGD with esophageal dilation improved dysphagia for about 10 months but dysphagia recurred. Esophageal manometry was advised if EGD was nondiagnostic or there was failure to improve with esophageal dilation, if indicated.  Patient presents for colonoscopy for colon cancer screening. The patient denies complaints of abdominal pain, significant change in bowel habits, or rectal bleeding.     Current Facility-Administered Medications:  .  0.9 %  sodium chloride infusion, , Intravenous, Continuous, [place], Benay Pike, MD, Last Rate: 20 mL/hr at 07/23/19 1337, 1,000 mL at 07/23/19 1337  Medications Prior to Admission  Medication Sig Dispense Refill Last Dose  . colchicine 0.6 MG tablet Take 0.6 mg by mouth 2 (two) times daily.    Past Week at Unknown time  . lisinopril-hydrochlorothiazide (PRINZIDE,ZESTORETIC) 10-12.5 MG per tablet Take 1 tablet by mouth daily.   07/23/2019 at Unknown time  . omeprazole (PRILOSEC) 20 MG capsule Take 20 mg by mouth daily.   Past Week at Unknown time  . sildenafil (VIAGRA) 100 MG tablet Take 100 mg by mouth daily as needed for erectile dysfunction.   Past Week at Unknown time     Allergies  Allergen Reactions  . Tramadol Nausea And Vomiting  . Tramadol-Acetaminophen Nausea Only     Past Medical History:  Diagnosis Date  . Hypertension     Review of systems:  Otherwise negative.    Physical Exam  Gen: Alert, oriented. Appears stated age.  HEENT: [place]/AT. PERRLA. Lungs: CTA, no wheezes. CV: RR nl S1, S2. Abd: soft, benign, no masses. BS+ Ext: No edema. Pulses 2+    Planned procedures: Proceed with EGD and colonoscopy. The patient understands the nature of the planned procedure, indications, risks,  alternatives and potential complications including but not limited to bleeding, infection, perforation, damage to internal organs and possible oversedation/side effects from anesthesia. The patient agrees and gives consent to proceed.  Please refer to procedure notes for findings, recommendations and patient disposition/instructions.     Teodoro K. Alice Reichert, M.D. Gastroenterology 07/23/2019  1:58 PM

## 2019-07-23 NOTE — Transfer of Care (Signed)
Immediate Anesthesia Transfer of Care Note  Patient: Ricky Compton  Procedure(s) Performed: ESOPHAGOGASTRODUODENOSCOPY (EGD) WITH PROPOFOL (N/A ) COLONOSCOPY WITH PROPOFOL (N/A )  Patient Location: PACU  Anesthesia Type:General  Level of Consciousness: drowsy  Airway & Oxygen Therapy: Patient Spontanous Breathing and Patient connected to face mask oxygen  Post-op Assessment: Report given to RN and Post -op Vital signs reviewed and stable  Post vital signs: Reviewed and stable  Last Vitals:  Vitals Value Taken Time  BP    Temp    Pulse    Resp    SpO2      Last Pain:  Vitals:   07/23/19 1322  TempSrc: Tympanic  PainSc: 0-No pain         Complications: No apparent anesthesia complications

## 2019-10-09 ENCOUNTER — Other Ambulatory Visit: Payer: Self-pay

## 2019-10-09 DIAGNOSIS — Z20822 Contact with and (suspected) exposure to covid-19: Secondary | ICD-10-CM

## 2019-10-11 ENCOUNTER — Telehealth: Payer: Self-pay | Admitting: Internal Medicine

## 2019-10-11 LAB — NOVEL CORONAVIRUS, NAA: SARS-CoV-2, NAA: NOT DETECTED

## 2019-10-11 NOTE — Telephone Encounter (Signed)
Patient called and received his covid test result °

## 2019-12-31 ENCOUNTER — Encounter: Payer: Self-pay | Admitting: Medical Oncology

## 2019-12-31 ENCOUNTER — Other Ambulatory Visit: Payer: Self-pay

## 2019-12-31 ENCOUNTER — Emergency Department
Admission: EM | Admit: 2019-12-31 | Discharge: 2019-12-31 | Disposition: A | Discharge status: Home / Self Care | Payer: 59 | Attending: Emergency Medicine | Admitting: Emergency Medicine

## 2019-12-31 ENCOUNTER — Emergency Department: Payer: 59

## 2019-12-31 DIAGNOSIS — I10 Essential (primary) hypertension: Secondary | ICD-10-CM | POA: Diagnosis not present

## 2019-12-31 DIAGNOSIS — Z79899 Other long term (current) drug therapy: Secondary | ICD-10-CM | POA: Diagnosis not present

## 2019-12-31 DIAGNOSIS — U071 COVID-19: Secondary | ICD-10-CM | POA: Diagnosis not present

## 2019-12-31 DIAGNOSIS — B349 Viral infection, unspecified: Secondary | ICD-10-CM

## 2019-12-31 DIAGNOSIS — R252 Cramp and spasm: Secondary | ICD-10-CM | POA: Diagnosis not present

## 2019-12-31 DIAGNOSIS — M7918 Myalgia, other site: Secondary | ICD-10-CM | POA: Diagnosis present

## 2019-12-31 LAB — COMPREHENSIVE METABOLIC PANEL
ALT: 24 U/L (ref 0–44)
AST: 22 U/L (ref 15–41)
Albumin: 4.4 g/dL (ref 3.5–5.0)
Alkaline Phosphatase: 59 U/L (ref 38–126)
Anion gap: 10 (ref 5–15)
BUN: 14 mg/dL (ref 6–20)
CO2: 31 mmol/L (ref 22–32)
Calcium: 9.5 mg/dL (ref 8.9–10.3)
Chloride: 97 mmol/L — ABNORMAL LOW (ref 98–111)
Creatinine, Ser: 0.99 mg/dL (ref 0.61–1.24)
GFR calc Af Amer: >60 mL/min (ref >60)
GFR calc non Af Amer: >60 mL/min (ref >60)
Glucose, Bld: 104 mg/dL — ABNORMAL HIGH (ref 70–99)
Potassium: 3.9 mmol/L (ref 3.5–5.1)
Sodium: 138 mmol/L (ref 135–145)
Total Bilirubin: 0.6 mg/dL (ref 0.3–1.2)
Total Protein: 7.9 g/dL (ref 6.5–8.1)

## 2019-12-31 LAB — URINALYSIS, COMPLETE (UACMP) WITH MICROSCOPIC
Bacteria, UA: NONE SEEN
Bilirubin Urine: NEGATIVE
Glucose, UA: NEGATIVE mg/dL
Hgb urine dipstick: NEGATIVE
Ketones, ur: NEGATIVE mg/dL
Leukocytes,Ua: NEGATIVE
Nitrite: NEGATIVE
Protein, ur: NEGATIVE mg/dL
Specific Gravity, Urine: 1.016 (ref 1.005–1.030)
pH: 5 (ref 5.0–8.0)

## 2019-12-31 LAB — CBC WITH DIFFERENTIAL/PLATELET
Abs Immature Granulocytes: 0.01 10*3/uL (ref 0.00–0.07)
Basophils Absolute: 0 10*3/uL (ref 0.0–0.1)
Basophils Relative: 1 %
Eosinophils Absolute: 0.1 10*3/uL (ref 0.0–0.5)
Eosinophils Relative: 1 %
HCT: 42.6 % (ref 39.0–52.0)
Hemoglobin: 14.5 g/dL (ref 13.0–17.0)
Immature Granulocytes: 0 %
Lymphocytes Relative: 41 %
Lymphs Abs: 1.7 10*3/uL (ref 0.7–4.0)
MCH: 31.7 pg (ref 26.0–34.0)
MCHC: 34 g/dL (ref 30.0–36.0)
MCV: 93.2 fL (ref 80.0–100.0)
Monocytes Absolute: 0.5 10*3/uL (ref 0.1–1.0)
Monocytes Relative: 12 %
Neutro Abs: 1.8 10*3/uL (ref 1.7–7.7)
Neutrophils Relative %: 45 %
Platelets: 234 10*3/uL (ref 150–400)
RBC: 4.57 MIL/uL (ref 4.22–5.81)
RDW: 13.4 % (ref 11.5–15.5)
WBC: 4 10*3/uL (ref 4.0–10.5)
nRBC: 0 % (ref 0.0–0.2)

## 2019-12-31 LAB — SARS CORONAVIRUS 2 (TAT 6-24 HRS): SARS Coronavirus 2: POSITIVE — AB

## 2019-12-31 LAB — MAGNESIUM: Magnesium: 2 mg/dL (ref 1.7–2.4)

## 2019-12-31 NOTE — Discharge Instructions (Signed)
Follow-up with your primary care provider if any continued problems.  Your lab work today is negative for any acute changes.  Also your potassium and magnesium were normal.  You might try pickle juice as we discussed for your leg cramps.  The Covid test will result within the next 24 hours.  You are to self quarantine until the results of that test have been received.  If it is positive you will need additional 10 days off from work.

## 2019-12-31 NOTE — ED Provider Notes (Signed)
[facility] Emergency Department Provider Note  ____________________________________________   First MD Initiated Contact with Patient 12/31/19 [phone]     (approximate)  I have reviewed the triage vital signs and the nursing notes.   HISTORY  Chief Complaint Generalized Body Aches and cramping   HPI Ricky Compton is a 49 y.o. male presents to the ED with complaint of generalized body aches, headache, and fatigue for 1 week.  Patient states that he had leg cramps in his left leg and ate mustard this morning to help with his leg cramps.  He had no relief with the mustard.  He did not get any relief.  Patient states he is in the past had problems with potassium which is caused leg cramps.  Patient also works at a group home.  He is not aware of any known exposure to Covid.  Patient is a non-smoker.  Currently rates his pain as 2/10.     Past Medical History:  Diagnosis Date  . Hypertension     There are no problems to display for this patient.   Past Surgical History:  Procedure Laterality Date  . COLONOSCOPY WITH PROPOFOL N/A 07/23/2019   Procedure: COLONOSCOPY WITH PROPOFOL;  Surgeon: Toledo, Boykin Nearing, MD;  Location: [facility] ENDOSCOPY;  Service: Endoscopy;  Laterality: N/A;  . ESOPHAGOGASTRODUODENOSCOPY  12/01/2017  . ESOPHAGOGASTRODUODENOSCOPY (EGD) WITH PROPOFOL N/A 07/23/2019   Procedure: ESOPHAGOGASTRODUODENOSCOPY (EGD) WITH PROPOFOL;  Surgeon: Toledo, Boykin Nearing, MD;  Location: [facility] ENDOSCOPY;  Service: Endoscopy;  Laterality: N/A;    Prior to Admission medications   Medication Sig Start Date End Date Taking? Authorizing Provider  colchicine 0.6 MG tablet Take 0.6 mg by mouth 2 (two) times daily.     [provider]  lisinopril-hydrochlorothiazide (PRINZIDE,ZESTORETIC) 10-12.5 MG per tablet Take 1 tablet by mouth daily. 02/05/15   [provider]  omeprazole (PRILOSEC) 20 MG capsule Take 20 mg by mouth daily.    [provider]  sildenafil (VIAGRA) 100 MG tablet Take 100 mg by mouth daily as needed for erectile dysfunction.    [provider]    Allergies Tramadol and Tramadol-acetaminophen  No family history on file.  Social History Social History   Tobacco Use  . Smoking status: Never Smoker  . Smokeless tobacco: Never Used  Substance Use Topics  . Alcohol use: No  . Drug use: Never    Review of Systems Constitutional: Subjective fever/chills Eyes: No visual changes. ENT: No sore throat. Cardiovascular: Denies chest pain. Respiratory: Denies shortness of breath. Gastrointestinal: No abdominal pain.  No nausea, no vomiting.  No diarrhea.  Genitourinary: Negative for dysuria. Musculoskeletal: Positive for generalized body aches, left leg muscle cramps. Skin: Negative for rash. Neurological: Negative for headaches, focal weakness or numbness. ____________________________________________   PHYSICAL EXAM:  VITAL SIGNS: ED Triage Vitals  Enc Vitals Group     BP 12/31/19 0824 108/80     Pulse Rate 12/31/19 0824 71     Resp 12/31/19 0824 19     Temp 12/31/19 0824 98.6 F (37 C)     Temp Source 12/31/19 0824 Oral     SpO2 12/31/19 0824 99 %     Weight 12/31/19 0825 220 lb (99.8 kg)     Height 12/31/19 0825 5\' 9"  (1.753 m)     Head Circumference --      Peak Flow --      Pain Score 12/31/19 0833 2     Pain Loc --  Pain Edu? --      Excl. in [place]? --    Constitutional: Alert and oriented. Well appearing and in no acute distress. Eyes: Conjunctivae are normal.  Head: Atraumatic. Nose: No congestion/rhinnorhea. Neck: No stridor.   Cardiovascular: Normal rate, regular rhythm. Grossly normal heart sounds.  Good peripheral circulation. Respiratory: Normal respiratory effort.  No retractions. Lungs faint expiratory wheeze heard throughout. Gastrointestinal: Soft and nontender. No distention. Musculoskeletal: No gross deformity or edema.  Patient is able to ambulate without any  assistance.  He is able to move upper and lower extremities no difficulty. Neurologic:  Normal speech and language. No gross focal neurologic deficits are appreciated. No gait instability. Skin:  Skin is warm, dry and intact. No rash noted. Psychiatric: Mood and affect are normal. Speech and behavior are normal.  ____________________________________________   LABS (all labs ordered are listed, but only abnormal results are displayed)  Labs Reviewed  URINALYSIS, COMPLETE (UACMP) WITH MICROSCOPIC - Abnormal; Notable for the following components:      Result Value   Color, Urine YELLOW (*)    APPearance CLEAR (*)    All other components within normal limits  COMPREHENSIVE METABOLIC PANEL - Abnormal; Notable for the following components:   Chloride 97 (*)    Glucose, Bld 104 (*)    All other components within normal limits  SARS CORONAVIRUS 2 (TAT 6-24 HRS)  CBC WITH DIFFERENTIAL/PLATELET  MAGNESIUM    RADIOLOGY  Official radiology report(s): DG Chest Portable 1 View  Result Date: 12/31/2019 CLINICAL DATA:  Body aches EXAM: PORTABLE CHEST 1 VIEW COMPARISON:  12/12/2018 FINDINGS: The heart size and mediastinal contours are within normal limits. Both lungs are clear. No pleural effusions. The visualized skeletal structures are unremarkable. IMPRESSION: No acute process in the chest. Electronically Signed   By: Macy Mis M.D.   On: 12/31/2019 10:01    ____________________________________________   PROCEDURES  Procedure(s) performed (including Critical Care):  Procedures   ____________________________________________   INITIAL IMPRESSION / ASSESSMENT AND PLAN / ED COURSE  As part of my medical decision making, I reviewed the following data within the electronic MEDICAL RECORD NUMBER Notes from prior ED visits and [place] Controlled Substance Database  49 year old male presents to the ED with complaint of generalized body aches, subjective fever and chills, and leg cramps.  Patient  works at a group home.  He is unaware of anyone being sick and no exposure to Covid that he is aware of.  Lab work was reassuring and patient was made aware that the leg cramps are not due to his potassium or magnesium.  We did discuss pickle juice to see if this helps with his leg cramps.  A Covid swab was sent to the lab and patient is aware that it will take 24 hours or less to receive the results.  He was given a note to quarantine until that time. ____________________________________________   FINAL CLINICAL IMPRESSION(S) / ED DIAGNOSES  Final diagnoses:  Viral illness  Leg cramps     ED Discharge Orders    None       Note:  This document was prepared using Dragon voice recognition software and may include unintentional dictation errors.    Johnn Hai, PA-C 12/31/19 1026    Arta Silence, MD 12/31/19 1051

## 2019-12-31 NOTE — ED Triage Notes (Signed)
Pt reports he has been having body aches, headaches and leg cramps for about 1 week. Denies any known covid exposure.

## 2020-01-01 ENCOUNTER — Telehealth: Payer: Self-pay | Admitting: Emergency Medicine

## 2020-01-01 NOTE — Telephone Encounter (Signed)
Gave patient positive covid result.  He will inform his employer [employer].  I explained isolation and quarantine cdc guidelines.  He does not have access to [employer] or email.  I will mail him a copy of his result at his request.

## 2020-01-12 ENCOUNTER — Emergency Department: Payer: 59

## 2020-01-12 ENCOUNTER — Other Ambulatory Visit: Payer: Self-pay

## 2020-01-12 ENCOUNTER — Emergency Department
Admission: EM | Admit: 2020-01-12 | Discharge: 2020-01-12 | Disposition: A | Discharge status: Home / Self Care | Payer: 59 | Attending: Emergency Medicine | Admitting: Emergency Medicine

## 2020-01-12 ENCOUNTER — Encounter: Payer: Self-pay | Admitting: Emergency Medicine

## 2020-01-12 DIAGNOSIS — I517 Cardiomegaly: Secondary | ICD-10-CM | POA: Diagnosis not present

## 2020-01-12 DIAGNOSIS — R0602 Shortness of breath: Secondary | ICD-10-CM

## 2020-01-12 DIAGNOSIS — K219 Gastro-esophageal reflux disease without esophagitis: Secondary | ICD-10-CM | POA: Insufficient documentation

## 2020-01-12 DIAGNOSIS — Z8616 Personal history of COVID-19: Secondary | ICD-10-CM

## 2020-01-12 DIAGNOSIS — I10 Essential (primary) hypertension: Secondary | ICD-10-CM | POA: Insufficient documentation

## 2020-01-12 DIAGNOSIS — Z79899 Other long term (current) drug therapy: Secondary | ICD-10-CM | POA: Diagnosis not present

## 2020-01-12 DIAGNOSIS — U071 COVID-19: Secondary | ICD-10-CM | POA: Diagnosis not present

## 2020-01-12 LAB — CBC
HCT: 45.2 % (ref 39.0–52.0)
Hemoglobin: 15 g/dL (ref 13.0–17.0)
MCH: 32.3 pg (ref 26.0–34.0)
MCHC: 33.2 g/dL (ref 30.0–36.0)
MCV: 97.2 fL (ref 80.0–100.0)
Platelets: 255 10*3/uL (ref 150–400)
RBC: 4.65 MIL/uL (ref 4.22–5.81)
RDW: 13.5 % (ref 11.5–15.5)
WBC: 7 10*3/uL (ref 4.0–10.5)
nRBC: 0 % (ref 0.0–0.2)

## 2020-01-12 LAB — TROPONIN I (HIGH SENSITIVITY): Troponin I (High Sensitivity): 3 ng/L (ref <18)

## 2020-01-12 LAB — BASIC METABOLIC PANEL
Anion gap: 7 (ref 5–15)
BUN: 13 mg/dL (ref 6–20)
CO2: 31 mmol/L (ref 22–32)
Calcium: 10 mg/dL (ref 8.9–10.3)
Chloride: 101 mmol/L (ref 98–111)
Creatinine, Ser: 0.82 mg/dL (ref 0.61–1.24)
GFR calc Af Amer: >60 mL/min (ref >60)
GFR calc non Af Amer: >60 mL/min (ref >60)
Glucose, Bld: 109 mg/dL — ABNORMAL HIGH (ref 70–99)
Potassium: 3.5 mmol/L (ref 3.5–5.1)
Sodium: 139 mmol/L (ref 135–145)

## 2020-01-12 LAB — BRAIN NATRIURETIC PEPTIDE: B Natriuretic Peptide: 30 pg/mL (ref 0.0–100.0)

## 2020-01-12 MED ORDER — OMEPRAZOLE 20 MG PO CPDR: 20.0000 mg | DELAYED_RELEASE_CAPSULE | Freq: Every day | ORAL | 0 refills | Status: AC

## 2020-01-12 MED ORDER — LIDOCAINE VISCOUS HCL 2 % MT SOLN
15.0000 mL | Freq: Once | OROMUCOSAL | Status: AC
  Administered 2020-01-12: 18:00:00 15 mL via ORAL
  Filled 2020-01-12: qty 15

## 2020-01-12 MED ORDER — ALUM & MAG HYDROXIDE-SIMETH 200-200-20 MG/5ML PO SUSP
30.0000 mL | Freq: Once | ORAL | Status: AC
  Administered 2020-01-12: 18:00:00 30 mL via ORAL
  Filled 2020-01-12: qty 30

## 2020-01-12 MED ORDER — IOHEXOL 350 MG/ML SOLN
75.0000 mL | Freq: Once | INTRAVENOUS | Status: AC | PRN
  Administered 2020-01-12: 100 mL via INTRAVENOUS
  Filled 2020-01-12: qty 75

## 2020-01-12 NOTE — Discharge Instructions (Signed)
Please follow-up with ENT for your history of acid reflux.  I have given you a prescription for omeprazole for this.  Your CT shows that your heart is mildly enlarged.  I have given you a referral to cardiology.  Your lab work is all very reassuring.  Please call primary care on Monday for a follow-up appointment.

## 2020-01-12 NOTE — ED Provider Notes (Signed)
[facility] Emergency Department Provider Note  ____________________________________________  Time seen: Approximately 5:33 PM  I have reviewed the triage vital signs and the nursing notes.   HISTORY  Chief Complaint Shortness of Breath and Chest Pain    HPI Ricky Compton is a 49 y.o. male that presents to the emergency department for evaluation of worsening shortness of breath today after being diagnosed with Covid on 1/4.  Patient states that he followed up with his primary care on Thursday, who told him that he needed to work on regaining his strength.  He has had some shortness of breath since being diagnosed with Covid-19.  He was at work today when his shortness of breath worsened.  He denies any SOB currently. He is also having some chest burning to the mid low chest, near his abdomen.  He does have a history of acid reflux. He sees GI for this and had an endoscopy done August 2020.   Patient has been checking his temperature twice a day and states that he never had a fever.  No cough.  Past Medical History:  Diagnosis Date  . Hypertension     There are no problems to display for this patient.   Past Surgical History:  Procedure Laterality Date  . COLONOSCOPY WITH PROPOFOL N/A 07/23/2019   Procedure: COLONOSCOPY WITH PROPOFOL;  Surgeon: Toledo, Boykin Nearing, MD;  Location: [facility] ENDOSCOPY;  Service: Endoscopy;  Laterality: N/A;  . ESOPHAGOGASTRODUODENOSCOPY  12/01/2017  . ESOPHAGOGASTRODUODENOSCOPY (EGD) WITH PROPOFOL N/A 07/23/2019   Procedure: ESOPHAGOGASTRODUODENOSCOPY (EGD) WITH PROPOFOL;  Surgeon: Toledo, Boykin Nearing, MD;  Location: [facility] ENDOSCOPY;  Service: Endoscopy;  Laterality: N/A;    Prior to Admission medications   Medication Sig Start Date End Date Taking? Authorizing Provider  colchicine 0.6 MG tablet Take 0.6 mg by mouth 2 (two) times daily.     [provider]  lisinopril-hydrochlorothiazide (PRINZIDE,ZESTORETIC) 10-12.5 MG per  tablet Take 1 tablet by mouth daily. 02/05/15   [provider]  omeprazole (PRILOSEC) 20 MG capsule Take 1 capsule (20 mg total) by mouth daily. 01/12/20 01/11/21  Enid Derry, PA-C  sildenafil (VIAGRA) 100 MG tablet Take 100 mg by mouth daily as needed for erectile dysfunction.    [provider]    Allergies Tramadol and Tramadol-acetaminophen  No family history on file.  Social History Social History   Tobacco Use  . Smoking status: Never Smoker  . Smokeless tobacco: Never Used  Substance Use Topics  . Alcohol use: No  . Drug use: Never     Review of Systems  Constitutional: No fever/chills Eyes: No visual changes. No discharge. ENT: Negative for congestion and rhinorrhea. Cardiovascular: Positive for chest wall pain. Respiratory: Negative for cough. Positive for SOB. Gastrointestinal: No abdominal pain.  No nausea, no vomiting.  No diarrhea.  No constipation. Musculoskeletal: Negative for musculoskeletal pain. Skin: Negative for rash, abrasions, lacerations, ecchymosis. Neurological: Negative for headaches.   ____________________________________________   PHYSICAL EXAM:  VITAL SIGNS: ED Triage Vitals  Enc Vitals Group     BP 01/12/20 1341 132/80     Pulse Rate 01/12/20 1341 77     Resp 01/12/20 1341 20     Temp 01/12/20 1341 99.1 F (37.3 C)     Temp Source 01/12/20 1341 Oral     SpO2 01/12/20 1341 98 %     Weight 01/12/20 1339 220 lb (99.8 kg)     Height 01/12/20 1339 5\' 9"  (1.753 m)     Head  Circumference --      Peak Flow --      Pain Score 01/12/20 1339 2     Pain Loc --      Pain Edu? --      Excl. in [place]? --      Constitutional: Alert and oriented. Well appearing and in no acute distress. Eyes: Conjunctivae are normal. PERRL. EOMI. No discharge. Head: Atraumatic. ENT: No frontal and maxillary sinus tenderness.      Ears:       Nose: No congestion/rhinnorhea.       Mouth/Throat: Mucous membranes are moist.  Cardiovascular:  Normal rate, regular rhythm.  Good peripheral circulation. Respiratory: Normal respiratory effort without tachypnea or retractions. Lungs CTAB. Good air entry to the bases with no decreased or absent breath sounds. Gastrointestinal: Bowel sounds 4 quadrants. Mild epigastric tenderness. No guarding or rigidity. No palpable masses. No distention. Musculoskeletal: Full range of motion to all extremities. No gross deformities appreciated. Neurologic:  Normal speech and language. No gross focal neurologic deficits are appreciated.  Skin:  Skin is warm, dry and intact. No rash noted. Psychiatric: Mood and affect are normal. Speech and behavior are normal. Patient exhibits appropriate insight and judgement.   ____________________________________________   LABS (all labs ordered are listed, but only abnormal results are displayed)  Labs Reviewed  BASIC METABOLIC PANEL - Abnormal; Notable for the following components:      Result Value   Glucose, Bld 109 (*)    All other components within normal limits  CBC  BRAIN NATRIURETIC PEPTIDE  TROPONIN I (HIGH SENSITIVITY)  TROPONIN I (HIGH SENSITIVITY)   ____________________________________________  EKG  NSR ____________________________________________  RADIOLOGY Robinette Haines, personally viewed and evaluated these images (plain radiographs) as part of my medical decision making, as well as reviewing the written report by the radiologist.  CT Angio Chest PE W and/or Wo Contrast  Result Date: 01/12/2020 CLINICAL DATA:  Shortness of breath. Chest pain. COVID positive 12/31/2019, 12 days ago. EXAM: CT ANGIOGRAPHY CHEST WITH CONTRAST TECHNIQUE: Multidetector CT imaging of the chest was performed using the standard protocol during bolus administration of intravenous contrast. Multiplanar CT image reconstructions and MIPs were obtained to evaluate the vascular anatomy. CONTRAST:  163mL OMNIPAQUE IOHEXOL 350 MG/ML SOLN COMPARISON:  Radiograph earlier  this day. FINDINGS: Cardiovascular: There are no filling defects within the pulmonary arteries to suggest pulmonary embolus. Thoracic aorta is normal in caliber without dissection. Mild multi chamber cardiomegaly. Contrast refluxes into the hepatic veins and IVC. No pericardial effusion. Mediastinum/Nodes: Dilated fluid and debris-filled esophagus to the gastroesophageal junction. Questionable wall thickening at the gastroesophageal junction, series 4, image 69. No mediastinal or hilar adenopathy. No suspicious thyroid nodule. Lungs/Pleura: Minor compressive atelectasis in the left lower lobe adjacent to dilated esophagus. No focal or ground-glass opacities. No pulmonary edema. No pleural fluid. No pulmonary mass. The trachea and mainstem bronchi are patent. Upper Abdomen: Minimal contrast refluxing into the hepatic veins and IVC. No other acute findings. Musculoskeletal: There are no acute or suspicious osseous abnormalities. 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 Review of the MIP images confirms the above findings. IMPRESSION: 1. No pulmonary embolus. 2. Dilated fluid and debris-filled esophagus to the gastroesophageal junction. Questionable wall thickening at the gastroesophageal junction. Findings may represent achalasia, however consider follow-up endoscopy or barium swallow to exclude underlying mass or structure. 3. Mild multi chamber cardiomegaly. Contrast refluxing into the hepatic veins and IVC suggesting elevated right heart pressures. 4. No pulmonary parenchymal findings of  COVID-19 pneumonia. Electronically Signed   By: Narda Rutherford M.D.   On: 01/12/2020 20:39    ____________________________________________    PROCEDURES  Procedure(s) performed:    Procedures    Medications  alum & mag hydroxide-simeth (MAALOX/MYLANTA) 200-200-20 MG/5ML suspension 30 mL (30 mLs Oral Given 01/12/20 1804)    And  lidocaine (XYLOCAINE) 2 % viscous mouth solution 15 mL (15 mLs Oral Given  01/12/20 1804)  iohexol (OMNIPAQUE) 350 MG/ML injection 75 mL (100 mLs Intravenous Contrast Given 01/12/20 2017)     ____________________________________________   INITIAL IMPRESSION / ASSESSMENT AND PLAN / ED COURSE  Pertinent labs & imaging results that were available during my care of the patient were reviewed by me and considered in my medical decision making (see chart for details).  Review of the [place] CSRS was performed in accordance of the [facility] prior to dispensing any controlled drugs.   Patient presented to the ER for evaluation of SOB following recent Covid-19 infection. Vital signs and exam are reassuring. Labwork largely unremarkable. Chest xray negative for acute cardiopulmonary processes. No PE on CT angio. CT findings were discussed with patient. Patient follows GI for his acid reflux and esophagus. Chest wall pain resolved following GI cocktail. CT shows mild cardiomegaly. BNP WNL. Troponin is negative. EKG shows NSR. Patient denies any SOB currently. Referral to cardiology was given. Patient appears well and is staying well hydrated. Patient feels comfortable going home. Patient will be discharged home with prescriptions for omeprazole. Patient is to follow up with PCP, GI, and cardiology as needed or otherwise directed. Patient is given ED precautions to return to the ED for any worsening or new symptoms.   Ricky Compton was evaluated in Emergency Department on 01/13/2020 for the symptoms described in the history of present illness. He was evaluated in the context of the global COVID-19 pandemic, which necessitated consideration that the patient might be at risk for infection with the SARS-CoV-2 virus that causes COVID-19. Institutional protocols and algorithms that pertain to the evaluation of patients at risk for COVID-19 are in a state of rapid change based on information released by regulatory bodies including the CDC and federal and state organizations. These policies and  algorithms were followed during the patient's care in the ED.  ____________________________________________  FINAL CLINICAL IMPRESSION(S) / ED DIAGNOSES  Final diagnoses:  History of COVID-19  Cardiomegaly  Gastroesophageal reflux disease, unspecified whether esophagitis present  SOB (shortness of breath)      NEW MEDICATIONS STARTED DURING THIS VISIT:  ED Discharge Orders         Ordered    omeprazole (PRILOSEC) 20 MG capsule  Daily     01/12/20 2131              This chart was dictated using voice recognition software/Dragon. Despite best efforts to proofread, errors can occur which can change the meaning. Any change was purely unintentional.    Enid Derry, PA-C 01/13/20 1526    Dionne Bucy, MD 01/13/20 [phone]

## 2020-01-12 NOTE — ED Notes (Signed)
IV access attempted by this RN x 2 without success and x1 by Dora Sims RN without success

## 2020-01-12 NOTE — ED Notes (Signed)
First Nurse Note: Pt to ED via [facility] from a store. Pt COVID + on 1/4. Pt has been having right to mid chest pain since this morning. 12 lead WNL per ems. Pt given 4 ASA.   Temp: 97.9  CBG 136  BP: 168/80 SpO2: 100% on room air

## 2020-01-12 NOTE — ED Triage Notes (Addendum)
Pt arrived via [facility] with reports of shortness of breath and chest pain, states he was positive for COVID on 1/4. Pt states he was seen by PCP and had CXR.   Pt states he continues to have shortness of breath and is fatigued.  Pt states he felt some soreness in the middle of chest.

## 2020-02-15 IMAGING — CT CT ANGIO CHEST
2 of 6 series · 18 of 46 positions shown · IV contrast (APPLIED)
Comparison: Radiograph earlier this day.

CLINICAL DATA: Shortness of breath. Chest pain. COVID positive
12/31/2019, 12 days ago.

EXAM:
CT ANGIOGRAPHY CHEST WITH CONTRAST
TECHNIQUE: Multidetector CT imaging of the chest was performed using the
standard protocol during bolus administration of intravenous
contrast. Multiplanar CT image reconstructions and MIPs were
obtained to evaluate the vascular anatomy.
CONTRAST:  100mL OMNIPAQUE IOHEXOL 350 MG/ML SOLN

[Series 5: thins · axial · 0.63mm/px · z∈[-559,-333]mm · 15 of 248 slices shown]
[im 11/248  lung]
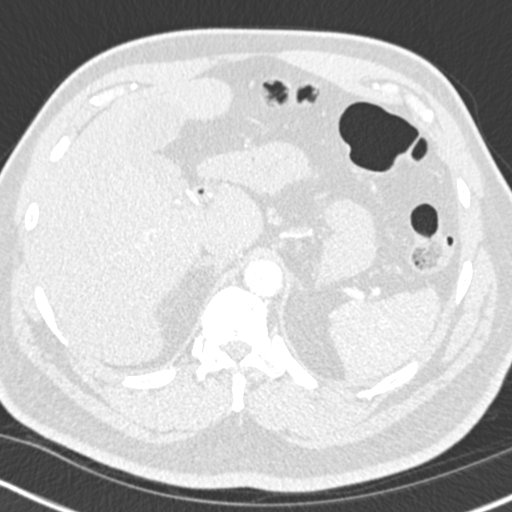
[im 33/248  soft-tissue]
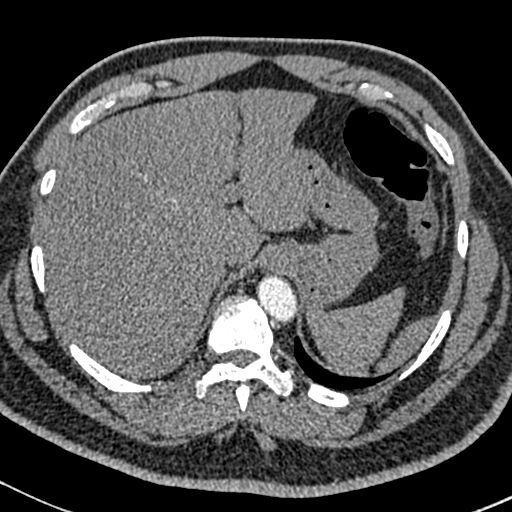
[im 43/248  lung]
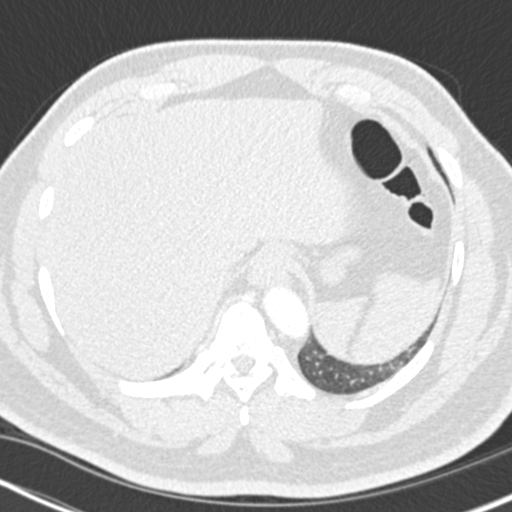
[im 65/248  soft-tissue]
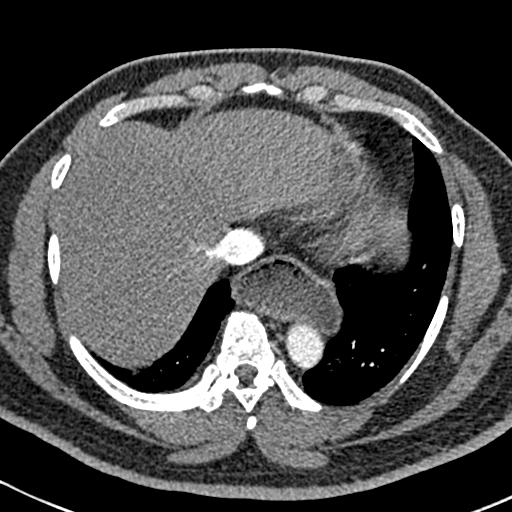
[im 76/248  lung]
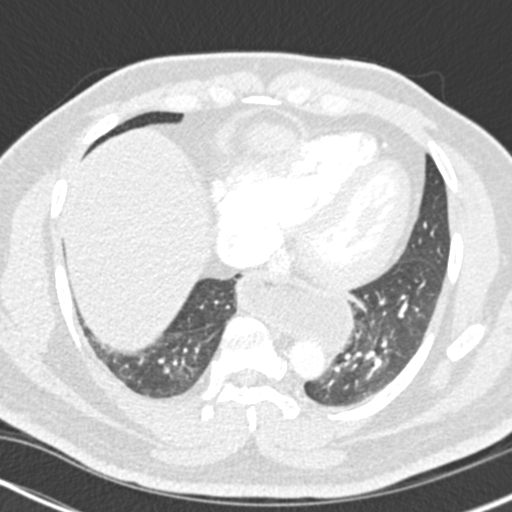
[im 97/248  soft-tissue]
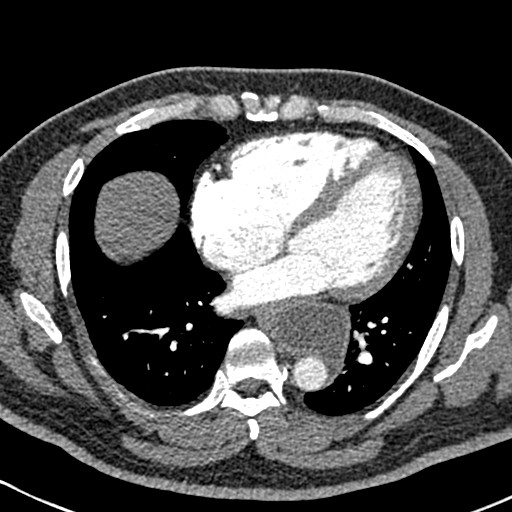
[im 108/248  lung]
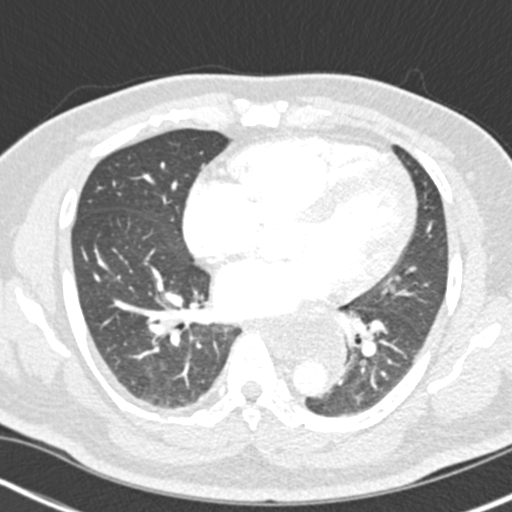
[im 129/248  soft-tissue]
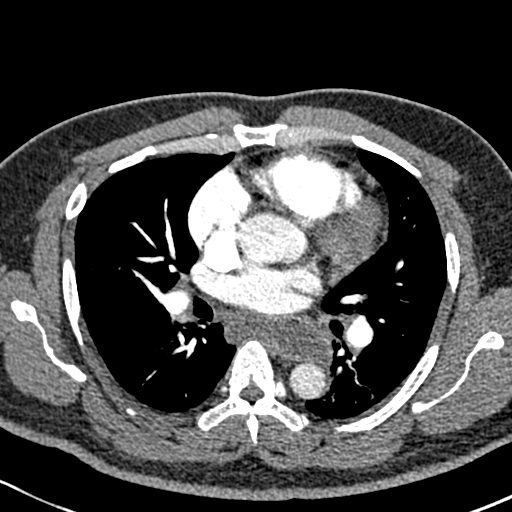
[im 140/248  lung]
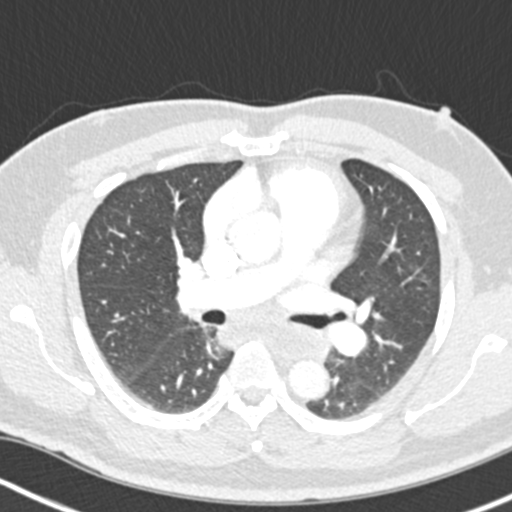
[im 151/248  soft-tissue]
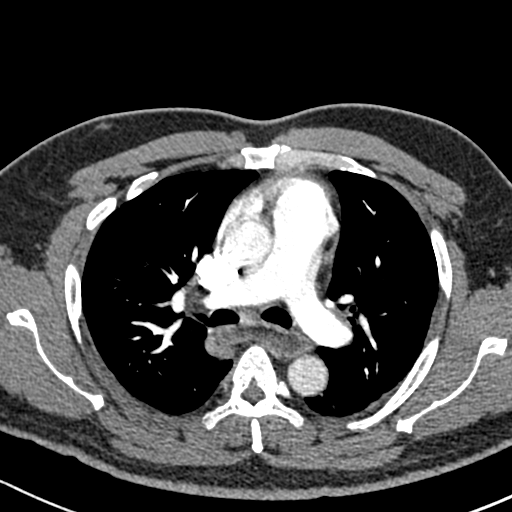
[im 172/248  lung]
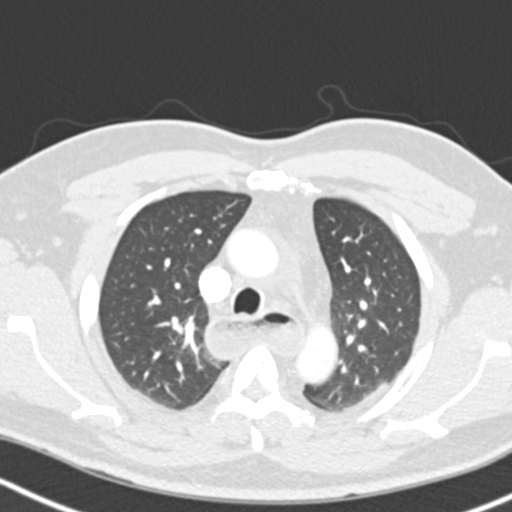
[im 183/248  soft-tissue]
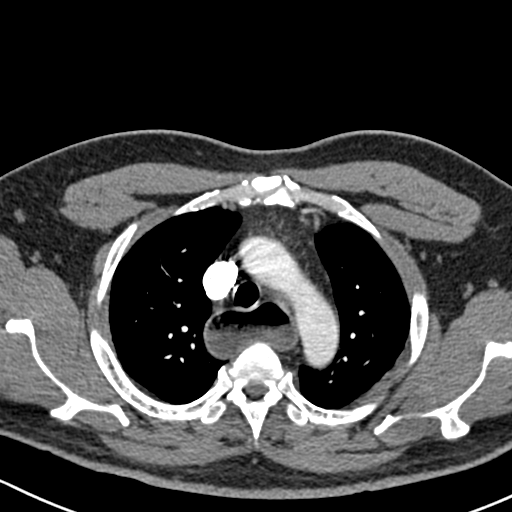
[im 205/248  lung]
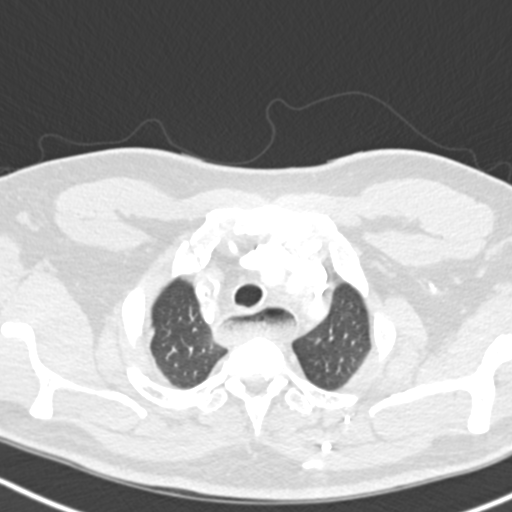
[im 215/248  soft-tissue]
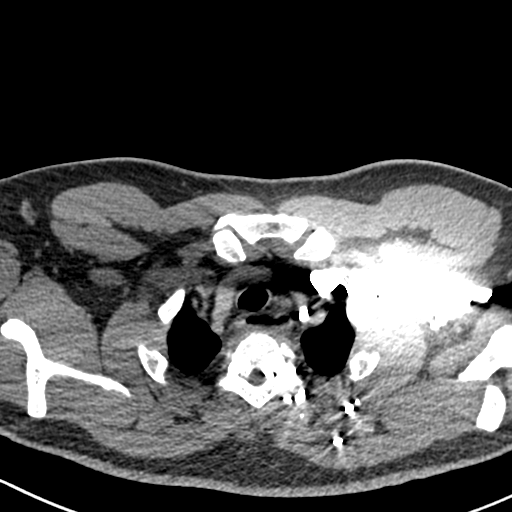
[im 237/248  lung]
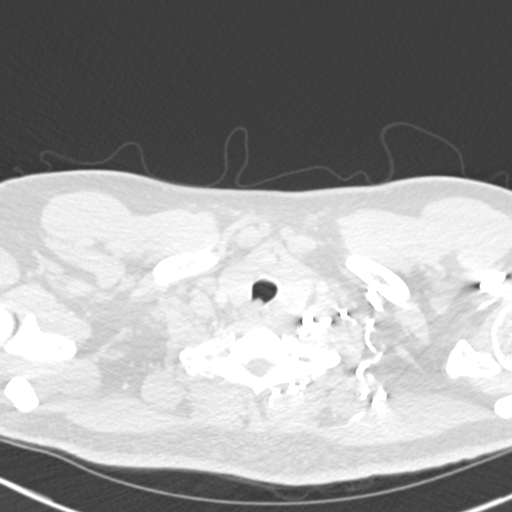

[Series 7: coronal mpr · coronal · 0.49mm/px · 3 of 94 slices shown]
[im 24/94  soft-tissue]
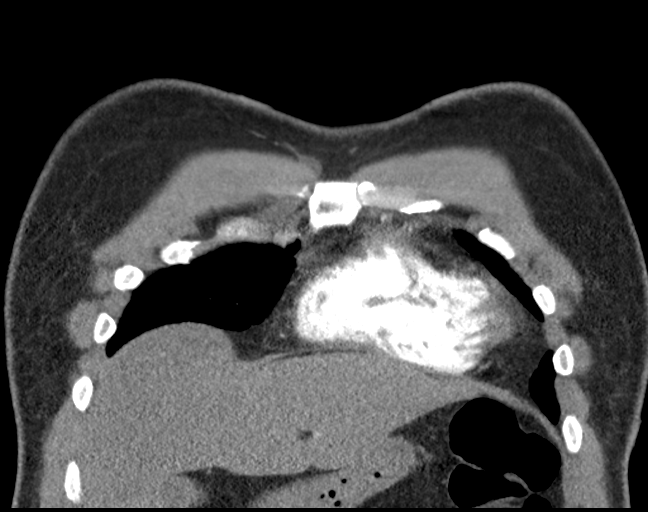
[im 47/94  soft-tissue]
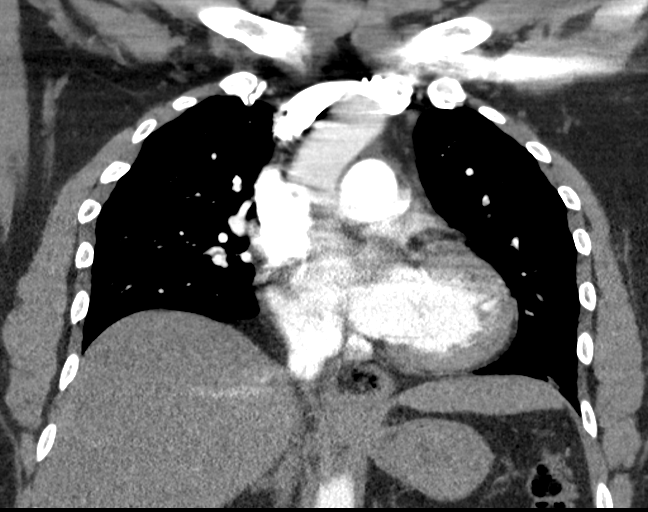
[im 70/94  soft-tissue]
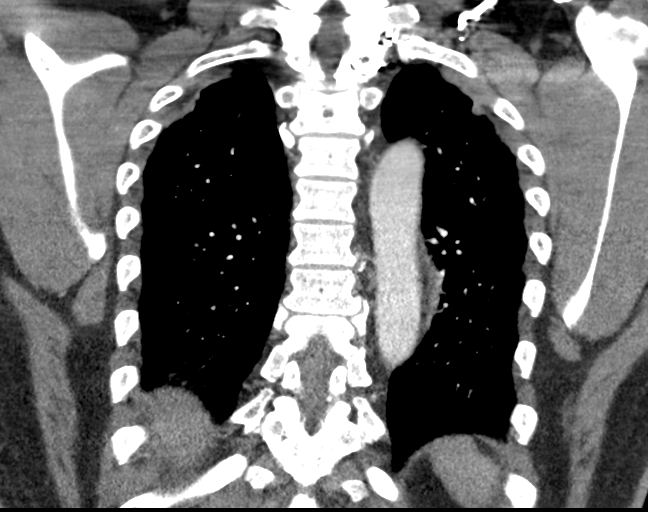

[18 of 46 positions shown; findings below may reference images not displayed]

FINDINGS: Cardiovascular: There are no filling defects within the pulmonary
arteries to suggest pulmonary embolus. Thoracic aorta is normal in
caliber without dissection. Mild multi chamber cardiomegaly.
Contrast refluxes into the hepatic veins and IVC. No pericardial
effusion.

Mediastinum/Nodes: Dilated fluid and debris-filled esophagus to the
gastroesophageal junction. Questionable wall thickening at the
gastroesophageal junction, series 4, image 69. No mediastinal or
hilar adenopathy. No suspicious thyroid nodule.

Lungs/Pleura: Minor compressive atelectasis in the left lower lobe
adjacent to dilated esophagus. No focal or ground-glass opacities.
No pulmonary edema. No pleural fluid. No pulmonary mass. The trachea
and mainstem bronchi are patent.

Upper Abdomen: Minimal contrast refluxing into the hepatic veins and
IVC. No other acute findings.

Musculoskeletal: There are no acute or suspicious osseous
abnormalities. Right fourth rib is bifid anteriorly, incidental.

Review of the MIP images confirms the above findings.
IMPRESSION: 1. No pulmonary embolus.
2. Dilated fluid and debris-filled esophagus to the gastroesophageal
junction. Questionable wall thickening at the gastroesophageal
junction. Findings may represent achalasia, however consider
follow-up endoscopy or barium swallow to exclude underlying mass or
structure.
3. Mild multi chamber cardiomegaly. Contrast refluxing into the
hepatic veins and IVC suggesting elevated right heart pressures.
4. No pulmonary parenchymal findings of [id number] pneumonia.

## 2020-02-15 IMAGING — CR DG CHEST 2V
1 series · 2 of 2 positions shown · non-contrast
Comparison: Chest x-ray 12/31/2019.

CLINICAL DATA: 48-year-old male with history of shortness of breath
and chest pain. Tested positive for [id number] on 12/31/2019.

EXAM:
CHEST - 2 VIEW

[Series 1: w chest pa · 0.14mm/px · 2 of 2 slices shown]
[im 1/2]
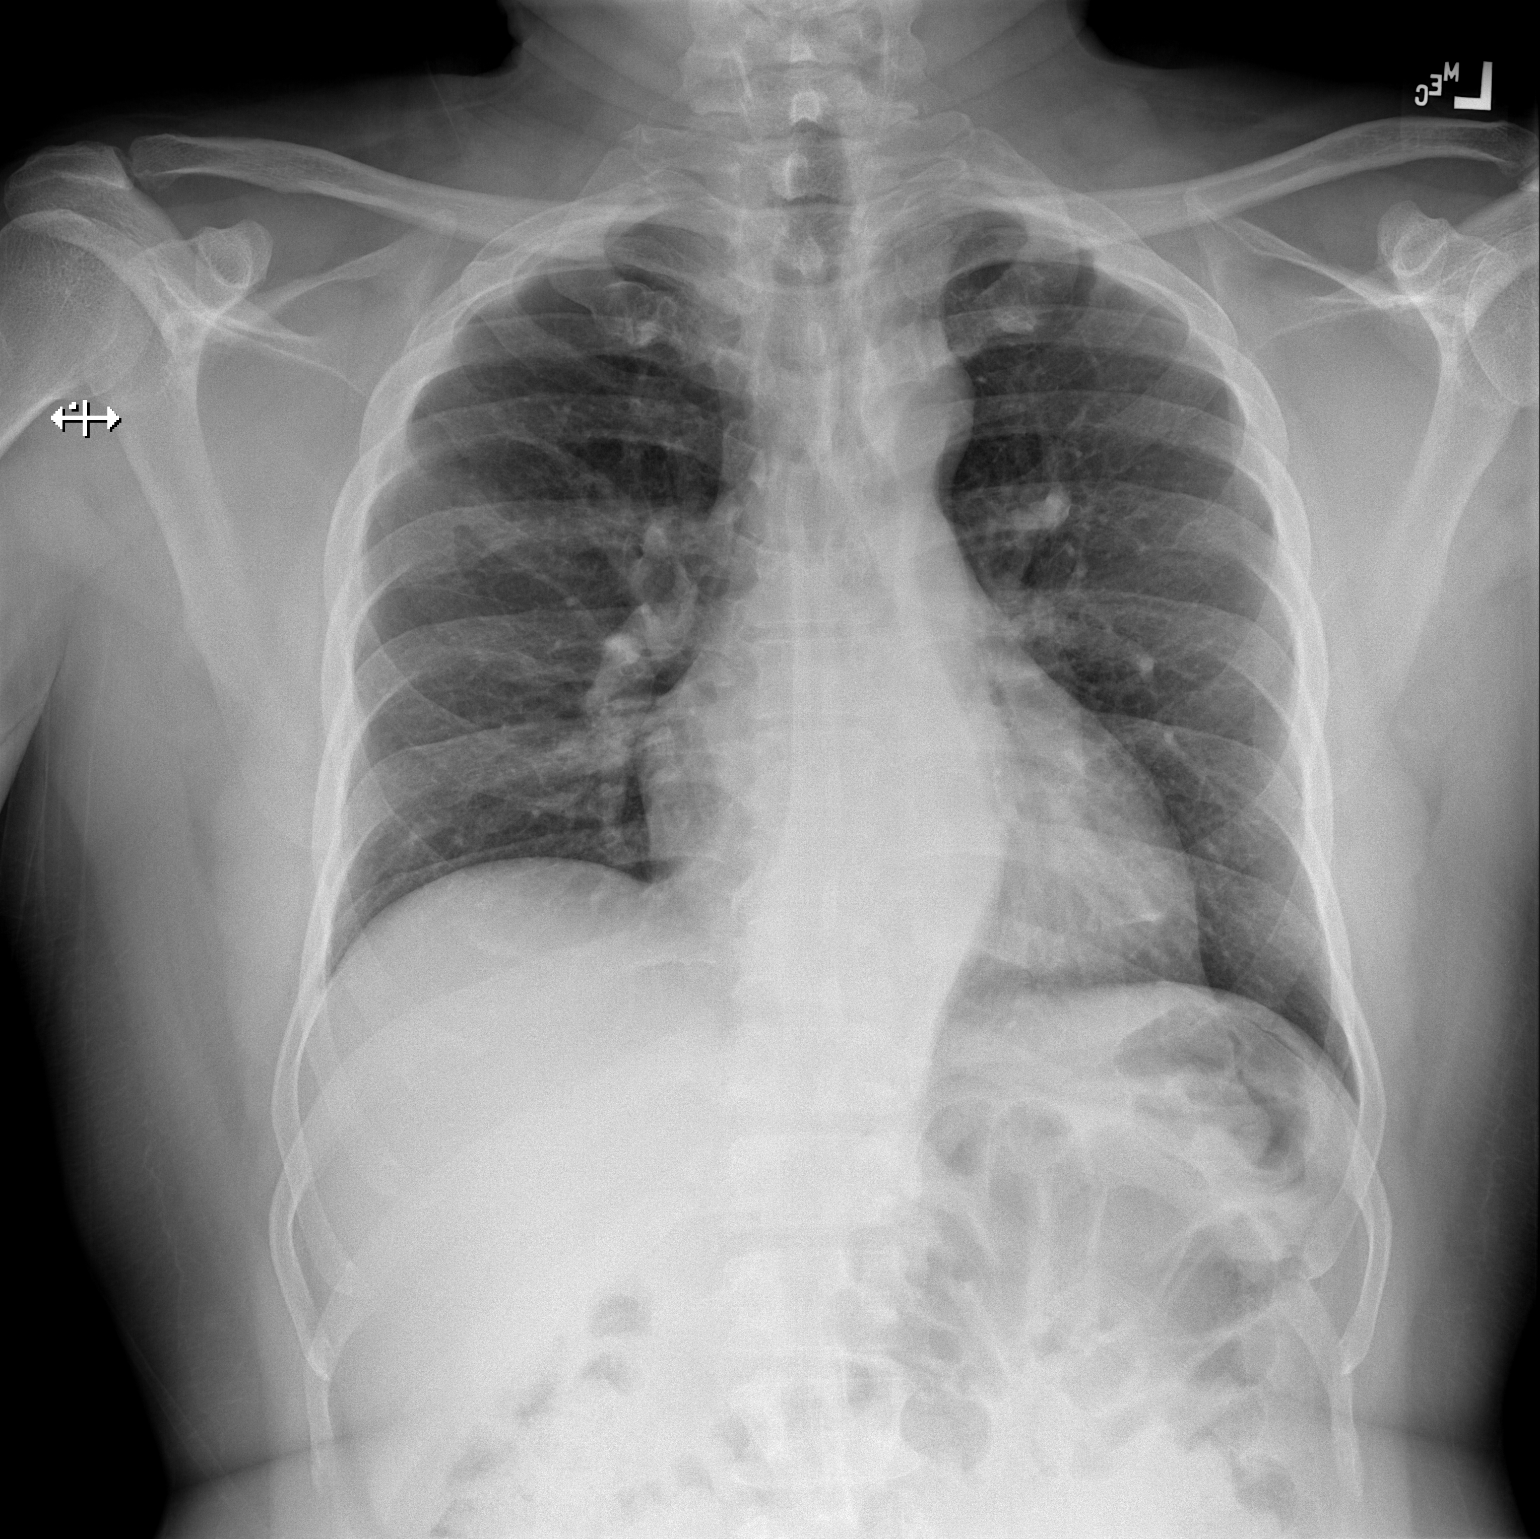
[im 2/2]
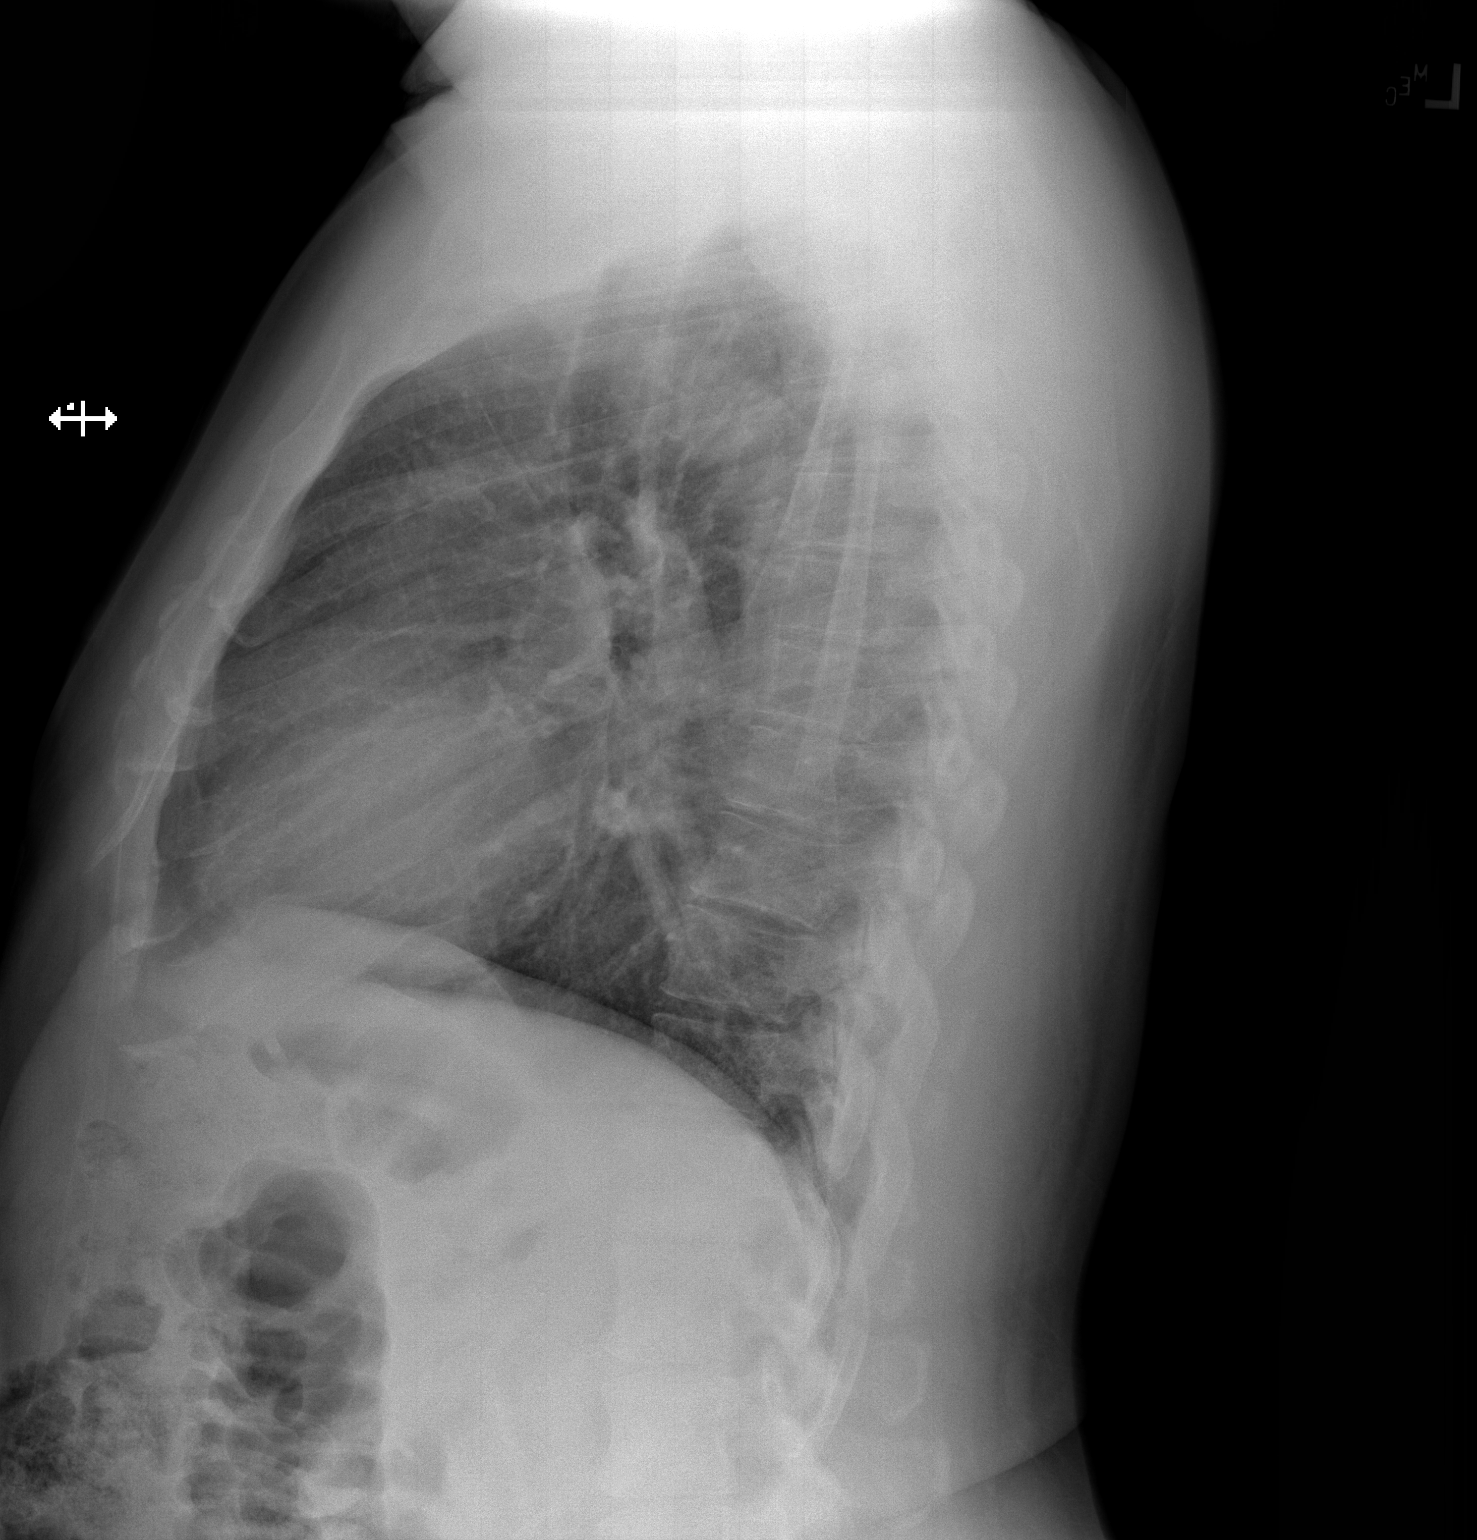

[2 of 2 positions shown; findings below may reference images not displayed]

FINDINGS: Lung volumes are normal. No consolidative airspace disease. No
pleural effusions. No pneumothorax. No pulmonary nodule or mass
noted. Pulmonary vasculature and the cardiomediastinal silhouette
are within normal limits.
IMPRESSION: No radiographic evidence of acute cardiopulmonary disease.

## 2022-06-30 DIAGNOSIS — K220 Achalasia of cardia: Secondary | ICD-10-CM | POA: Diagnosis not present

## 2022-07-15 DIAGNOSIS — Z978 Presence of other specified devices: Secondary | ICD-10-CM | POA: Diagnosis not present

## 2022-07-15 DIAGNOSIS — T85528D Displacement of other gastrointestinal prosthetic devices, implants and grafts, subsequent encounter: Secondary | ICD-10-CM | POA: Diagnosis not present

## 2022-07-15 DIAGNOSIS — K2210 Ulcer of esophagus without bleeding: Secondary | ICD-10-CM | POA: Diagnosis not present

## 2022-07-15 DIAGNOSIS — E669 Obesity, unspecified: Secondary | ICD-10-CM | POA: Diagnosis not present

## 2022-07-15 DIAGNOSIS — Z4659 Encounter for fitting and adjustment of other gastrointestinal appliance and device: Secondary | ICD-10-CM | POA: Diagnosis not present

## 2022-07-15 DIAGNOSIS — Z885 Allergy status to narcotic agent status: Secondary | ICD-10-CM | POA: Diagnosis not present

## 2022-08-26 DIAGNOSIS — I10 Essential (primary) hypertension: Secondary | ICD-10-CM | POA: Diagnosis not present

## 2022-09-02 DIAGNOSIS — Z125 Encounter for screening for malignant neoplasm of prostate: Secondary | ICD-10-CM | POA: Diagnosis not present

## 2022-09-02 DIAGNOSIS — K219 Gastro-esophageal reflux disease without esophagitis: Secondary | ICD-10-CM | POA: Diagnosis not present

## 2022-09-02 DIAGNOSIS — E7800 Pure hypercholesterolemia, unspecified: Secondary | ICD-10-CM | POA: Diagnosis not present

## 2022-09-02 DIAGNOSIS — I10 Essential (primary) hypertension: Secondary | ICD-10-CM | POA: Diagnosis not present

## 2022-10-06 DIAGNOSIS — K220 Achalasia of cardia: Secondary | ICD-10-CM | POA: Diagnosis not present

## 2023-01-13 DIAGNOSIS — I10 Essential (primary) hypertension: Secondary | ICD-10-CM | POA: Diagnosis not present

## 2023-01-13 DIAGNOSIS — I361 Nonrheumatic tricuspid (valve) insufficiency: Secondary | ICD-10-CM | POA: Diagnosis not present

## 2023-01-13 DIAGNOSIS — E7800 Pure hypercholesterolemia, unspecified: Secondary | ICD-10-CM | POA: Diagnosis not present

## 2023-01-14 DIAGNOSIS — I361 Nonrheumatic tricuspid (valve) insufficiency: Secondary | ICD-10-CM | POA: Diagnosis not present

## 2023-03-17 DIAGNOSIS — I10 Essential (primary) hypertension: Secondary | ICD-10-CM | POA: Diagnosis not present

## 2023-03-17 DIAGNOSIS — Z125 Encounter for screening for malignant neoplasm of prostate: Secondary | ICD-10-CM | POA: Diagnosis not present

## 2023-03-24 DIAGNOSIS — Z1331 Encounter for screening for depression: Secondary | ICD-10-CM | POA: Diagnosis not present

## 2023-03-24 DIAGNOSIS — I10 Essential (primary) hypertension: Secondary | ICD-10-CM | POA: Diagnosis not present

## 2023-03-24 DIAGNOSIS — Z0001 Encounter for general adult medical examination with abnormal findings: Secondary | ICD-10-CM | POA: Diagnosis not present

## 2023-03-24 DIAGNOSIS — E7800 Pure hypercholesterolemia, unspecified: Secondary | ICD-10-CM | POA: Diagnosis not present

## 2023-03-24 DIAGNOSIS — K219 Gastro-esophageal reflux disease without esophagitis: Secondary | ICD-10-CM | POA: Diagnosis not present

## 2023-03-24 DIAGNOSIS — K220 Achalasia of cardia: Secondary | ICD-10-CM | POA: Diagnosis not present

## 2023-04-14 DIAGNOSIS — Z885 Allergy status to narcotic agent status: Secondary | ICD-10-CM | POA: Diagnosis not present

## 2023-04-14 DIAGNOSIS — E669 Obesity, unspecified: Secondary | ICD-10-CM | POA: Diagnosis not present

## 2023-04-14 DIAGNOSIS — Z6835 Body mass index (BMI) 35.0-35.9, adult: Secondary | ICD-10-CM | POA: Diagnosis not present

## 2023-04-14 DIAGNOSIS — K209 Esophagitis, unspecified without bleeding: Secondary | ICD-10-CM | POA: Diagnosis not present

## 2023-04-14 DIAGNOSIS — Z09 Encounter for follow-up examination after completed treatment for conditions other than malignant neoplasm: Secondary | ICD-10-CM | POA: Diagnosis not present

## 2023-04-14 DIAGNOSIS — K220 Achalasia of cardia: Secondary | ICD-10-CM | POA: Diagnosis not present

## 2023-04-14 DIAGNOSIS — I10 Essential (primary) hypertension: Secondary | ICD-10-CM | POA: Diagnosis not present

## 2023-04-14 DIAGNOSIS — Z8719 Personal history of other diseases of the digestive system: Secondary | ICD-10-CM | POA: Diagnosis not present

## 2023-05-05 DIAGNOSIS — R310 Gross hematuria: Secondary | ICD-10-CM | POA: Diagnosis not present

## 2023-05-05 DIAGNOSIS — R319 Hematuria, unspecified: Secondary | ICD-10-CM | POA: Diagnosis not present

## 2023-05-16 DIAGNOSIS — R319 Hematuria, unspecified: Secondary | ICD-10-CM | POA: Diagnosis not present

## 2023-08-08 DIAGNOSIS — Z20822 Contact with and (suspected) exposure to covid-19: Secondary | ICD-10-CM | POA: Diagnosis not present

## 2023-08-08 DIAGNOSIS — U071 COVID-19: Secondary | ICD-10-CM | POA: Diagnosis not present

## 2023-09-22 DIAGNOSIS — I10 Essential (primary) hypertension: Secondary | ICD-10-CM | POA: Diagnosis not present

## 2023-09-29 DIAGNOSIS — Z125 Encounter for screening for malignant neoplasm of prostate: Secondary | ICD-10-CM | POA: Diagnosis not present

## 2023-09-29 DIAGNOSIS — K219 Gastro-esophageal reflux disease without esophagitis: Secondary | ICD-10-CM | POA: Diagnosis not present

## 2023-09-29 DIAGNOSIS — E7800 Pure hypercholesterolemia, unspecified: Secondary | ICD-10-CM | POA: Diagnosis not present

## 2023-09-29 DIAGNOSIS — I10 Essential (primary) hypertension: Secondary | ICD-10-CM | POA: Diagnosis not present

## 2023-09-29 DIAGNOSIS — Z23 Encounter for immunization: Secondary | ICD-10-CM | POA: Diagnosis not present

## 2023-10-20 DIAGNOSIS — K220 Achalasia of cardia: Secondary | ICD-10-CM | POA: Diagnosis not present

## 2023-10-20 DIAGNOSIS — Z9889 Other specified postprocedural states: Secondary | ICD-10-CM | POA: Diagnosis not present

## 2024-03-12 DIAGNOSIS — I10 Essential (primary) hypertension: Secondary | ICD-10-CM | POA: Diagnosis not present

## 2024-03-12 DIAGNOSIS — Z125 Encounter for screening for malignant neoplasm of prostate: Secondary | ICD-10-CM | POA: Diagnosis not present

## 2024-04-02 DIAGNOSIS — E7800 Pure hypercholesterolemia, unspecified: Secondary | ICD-10-CM | POA: Diagnosis not present

## 2024-04-02 DIAGNOSIS — Z1339 Encounter for screening examination for other mental health and behavioral disorders: Secondary | ICD-10-CM | POA: Diagnosis not present

## 2024-04-02 DIAGNOSIS — Z1331 Encounter for screening for depression: Secondary | ICD-10-CM | POA: Diagnosis not present

## 2024-04-02 DIAGNOSIS — Z0001 Encounter for general adult medical examination with abnormal findings: Secondary | ICD-10-CM | POA: Diagnosis not present

## 2024-04-02 DIAGNOSIS — K219 Gastro-esophageal reflux disease without esophagitis: Secondary | ICD-10-CM | POA: Diagnosis not present

## 2024-04-02 DIAGNOSIS — I10 Essential (primary) hypertension: Secondary | ICD-10-CM | POA: Diagnosis not present

## 2024-09-26 DIAGNOSIS — I10 Essential (primary) hypertension: Secondary | ICD-10-CM | POA: Diagnosis not present

## 2024-10-03 DIAGNOSIS — E7800 Pure hypercholesterolemia, unspecified: Secondary | ICD-10-CM | POA: Diagnosis not present

## 2024-10-03 DIAGNOSIS — I10 Essential (primary) hypertension: Secondary | ICD-10-CM | POA: Diagnosis not present

## 2024-10-03 DIAGNOSIS — Z125 Encounter for screening for malignant neoplasm of prostate: Secondary | ICD-10-CM | POA: Diagnosis not present

## 2024-10-03 DIAGNOSIS — Z23 Encounter for immunization: Secondary | ICD-10-CM | POA: Diagnosis not present

## 2024-10-03 DIAGNOSIS — R1319 Other dysphagia: Secondary | ICD-10-CM | POA: Diagnosis not present

## 2024-10-03 DIAGNOSIS — K219 Gastro-esophageal reflux disease without esophagitis: Secondary | ICD-10-CM | POA: Diagnosis not present
